# Patient Record
Sex: Female | Born: 1945
Health system: Southern US, Community
[De-identification: ages and names within clinical notes are randomized; demographics above are authoritative.]

## PROBLEM LIST (undated history)

## (undated) DIAGNOSIS — C801 Malignant (primary) neoplasm, unspecified: Secondary | ICD-10-CM

## (undated) DIAGNOSIS — Z923 Personal history of irradiation: Secondary | ICD-10-CM

---

## 1998-06-19 ENCOUNTER — Ambulatory Visit: Admission: RE | Admit: 1998-06-19 | Discharge: 1998-06-19 | Payer: Self-pay | Admitting: *Deleted

## 1998-06-21 ENCOUNTER — Ambulatory Visit (HOSPITAL_COMMUNITY): Admission: RE | Admit: 1998-06-21 | Discharge: 1998-06-21 | Payer: Self-pay | Admitting: *Deleted

## 1998-10-29 ENCOUNTER — Emergency Department (HOSPITAL_COMMUNITY): Admission: EM | Admit: 1998-10-29 | Discharge: 1998-10-29 | Payer: Self-pay

## 1998-12-09 DIAGNOSIS — Z923 Personal history of irradiation: Secondary | ICD-10-CM

## 1998-12-09 DIAGNOSIS — C801 Malignant (primary) neoplasm, unspecified: Secondary | ICD-10-CM

## 1998-12-09 HISTORY — DX: Personal history of irradiation: Z92.3

## 1998-12-09 HISTORY — PX: BREAST LUMPECTOMY: SHX2

## 1998-12-09 HISTORY — DX: Malignant (primary) neoplasm, unspecified: C80.1

## 1999-04-25 ENCOUNTER — Ambulatory Visit (HOSPITAL_COMMUNITY): Admission: RE | Admit: 1999-04-25 | Discharge: 1999-04-25 | Payer: Self-pay | Admitting: *Deleted

## 1999-04-25 ENCOUNTER — Encounter: Payer: Self-pay | Admitting: *Deleted

## 1999-04-26 ENCOUNTER — Encounter: Payer: Self-pay | Admitting: *Deleted

## 1999-04-26 ENCOUNTER — Ambulatory Visit (HOSPITAL_COMMUNITY): Admission: RE | Admit: 1999-04-26 | Discharge: 1999-04-26 | Payer: Self-pay | Admitting: *Deleted

## 1999-05-03 ENCOUNTER — Ambulatory Visit (HOSPITAL_COMMUNITY): Admission: RE | Admit: 1999-05-03 | Discharge: 1999-05-03 | Payer: Self-pay | Admitting: *Deleted

## 1999-05-16 ENCOUNTER — Ambulatory Visit (HOSPITAL_COMMUNITY): Admission: RE | Admit: 1999-05-16 | Discharge: 1999-05-16 | Payer: Self-pay | Admitting: *Deleted

## 1999-05-24 ENCOUNTER — Ambulatory Visit (HOSPITAL_COMMUNITY): Admission: RE | Admit: 1999-05-24 | Discharge: 1999-05-24 | Payer: Self-pay | Admitting: *Deleted

## 1999-06-09 ENCOUNTER — Encounter: Admission: RE | Admit: 1999-06-09 | Discharge: 1999-09-07 | Payer: Self-pay | Admitting: Radiation Oncology

## 1999-06-21 ENCOUNTER — Ambulatory Visit (HOSPITAL_COMMUNITY): Admission: RE | Admit: 1999-06-21 | Discharge: 1999-06-21 | Payer: Self-pay | Admitting: Family Medicine

## 1999-06-21 ENCOUNTER — Encounter: Payer: Self-pay | Admitting: Family Medicine

## 1999-11-09 ENCOUNTER — Other Ambulatory Visit: Admission: RE | Admit: 1999-11-09 | Discharge: 1999-11-09 | Payer: Self-pay | Admitting: *Deleted

## 1999-11-22 ENCOUNTER — Encounter: Payer: Self-pay | Admitting: *Deleted

## 1999-11-22 ENCOUNTER — Encounter: Admission: RE | Admit: 1999-11-22 | Discharge: 1999-11-22 | Payer: Self-pay | Admitting: *Deleted

## 2000-01-01 ENCOUNTER — Encounter: Payer: Self-pay | Admitting: *Deleted

## 2000-01-01 ENCOUNTER — Ambulatory Visit (HOSPITAL_COMMUNITY): Admission: RE | Admit: 2000-01-01 | Discharge: 2000-01-01 | Payer: Self-pay | Admitting: *Deleted

## 2000-07-30 ENCOUNTER — Encounter: Payer: Self-pay | Admitting: *Deleted

## 2000-07-30 ENCOUNTER — Ambulatory Visit (HOSPITAL_COMMUNITY): Admission: RE | Admit: 2000-07-30 | Discharge: 2000-07-30 | Payer: Self-pay | Admitting: *Deleted

## 2001-01-01 ENCOUNTER — Encounter: Admission: RE | Admit: 2001-01-01 | Discharge: 2001-01-01 | Payer: Self-pay | Admitting: Radiation Oncology

## 2001-05-28 ENCOUNTER — Encounter: Admission: RE | Admit: 2001-05-28 | Discharge: 2001-05-28 | Payer: Self-pay | Admitting: Radiation Oncology

## 2002-06-03 ENCOUNTER — Encounter: Admission: RE | Admit: 2002-06-03 | Discharge: 2002-06-03 | Payer: Self-pay | Admitting: Radiation Oncology

## 2003-06-06 ENCOUNTER — Encounter: Admission: RE | Admit: 2003-06-06 | Discharge: 2003-06-06 | Payer: Self-pay | Admitting: Family Medicine

## 2003-06-06 ENCOUNTER — Encounter: Payer: Self-pay | Admitting: Family Medicine

## 2004-02-27 ENCOUNTER — Other Ambulatory Visit: Admission: RE | Admit: 2004-02-27 | Discharge: 2004-02-27 | Payer: Self-pay | Admitting: Family Medicine

## 2004-06-06 ENCOUNTER — Encounter: Admission: RE | Admit: 2004-06-06 | Discharge: 2004-06-06 | Payer: Self-pay | Admitting: Family Medicine

## 2005-02-27 ENCOUNTER — Other Ambulatory Visit: Admission: RE | Admit: 2005-02-27 | Discharge: 2005-02-27 | Payer: Self-pay | Admitting: Family Medicine

## 2005-05-22 ENCOUNTER — Ambulatory Visit (HOSPITAL_COMMUNITY): Admission: RE | Admit: 2005-05-22 | Discharge: 2005-05-22 | Payer: Self-pay | Admitting: Gastroenterology

## 2005-06-07 ENCOUNTER — Encounter: Admission: RE | Admit: 2005-06-07 | Discharge: 2005-06-07 | Payer: Self-pay | Admitting: Family Medicine

## 2005-06-18 ENCOUNTER — Encounter: Admission: RE | Admit: 2005-06-18 | Discharge: 2005-06-18 | Payer: Self-pay | Admitting: Family Medicine

## 2005-06-23 ENCOUNTER — Encounter: Admission: RE | Admit: 2005-06-23 | Discharge: 2005-06-23 | Payer: Self-pay | Admitting: Family Medicine

## 2006-04-08 ENCOUNTER — Emergency Department (HOSPITAL_COMMUNITY): Admission: EM | Admit: 2006-04-08 | Discharge: 2006-04-09 | Payer: Self-pay | Admitting: Emergency Medicine

## 2006-04-15 ENCOUNTER — Other Ambulatory Visit: Admission: RE | Admit: 2006-04-15 | Discharge: 2006-04-15 | Payer: Self-pay | Admitting: Family Medicine

## 2006-06-09 ENCOUNTER — Encounter: Admission: RE | Admit: 2006-06-09 | Discharge: 2006-06-09 | Payer: Self-pay | Admitting: Family Medicine

## 2007-03-05 ENCOUNTER — Encounter: Admission: RE | Admit: 2007-03-05 | Discharge: 2007-03-05 | Payer: Self-pay | Admitting: Family Medicine

## 2007-04-12 ENCOUNTER — Encounter: Admission: RE | Admit: 2007-04-12 | Discharge: 2007-04-12 | Payer: Self-pay | Admitting: Family Medicine

## 2007-05-13 ENCOUNTER — Other Ambulatory Visit: Admission: RE | Admit: 2007-05-13 | Discharge: 2007-05-13 | Payer: Self-pay | Admitting: Family Medicine

## 2007-05-16 ENCOUNTER — Emergency Department (HOSPITAL_COMMUNITY): Admission: EM | Admit: 2007-05-16 | Discharge: 2007-05-16 | Payer: Self-pay | Admitting: *Deleted

## 2007-06-08 ENCOUNTER — Encounter: Admission: RE | Admit: 2007-06-08 | Discharge: 2007-06-08 | Payer: Self-pay | Admitting: Family Medicine

## 2007-06-29 ENCOUNTER — Emergency Department (HOSPITAL_COMMUNITY): Admission: EM | Admit: 2007-06-29 | Discharge: 2007-06-29 | Payer: Self-pay | Admitting: Emergency Medicine

## 2007-07-04 ENCOUNTER — Emergency Department (HOSPITAL_COMMUNITY): Admission: EM | Admit: 2007-07-04 | Discharge: 2007-07-04 | Payer: Self-pay | Admitting: Emergency Medicine

## 2007-07-06 ENCOUNTER — Encounter: Admission: RE | Admit: 2007-07-06 | Discharge: 2007-07-06 | Payer: Self-pay | Admitting: Gastroenterology

## 2007-07-10 ENCOUNTER — Ambulatory Visit (HOSPITAL_COMMUNITY): Admission: RE | Admit: 2007-07-10 | Discharge: 2007-07-10 | Payer: Self-pay | Admitting: Gastroenterology

## 2007-07-14 ENCOUNTER — Ambulatory Visit: Payer: Self-pay | Admitting: Hematology and Oncology

## 2007-07-15 ENCOUNTER — Encounter: Admission: RE | Admit: 2007-07-15 | Discharge: 2007-07-15 | Payer: Self-pay | Admitting: Gastroenterology

## 2008-03-07 ENCOUNTER — Encounter: Admission: RE | Admit: 2008-03-07 | Discharge: 2008-03-07 | Payer: Self-pay | Admitting: Family Medicine

## 2008-05-17 ENCOUNTER — Other Ambulatory Visit: Admission: RE | Admit: 2008-05-17 | Discharge: 2008-05-17 | Payer: Self-pay | Admitting: Family Medicine

## 2009-03-14 ENCOUNTER — Encounter: Admission: RE | Admit: 2009-03-14 | Discharge: 2009-03-14 | Payer: Self-pay | Admitting: Family Medicine

## 2009-07-04 ENCOUNTER — Other Ambulatory Visit: Admission: RE | Admit: 2009-07-04 | Discharge: 2009-07-04 | Payer: Self-pay | Admitting: Family Medicine

## 2010-03-20 ENCOUNTER — Encounter: Admission: RE | Admit: 2010-03-20 | Discharge: 2010-03-20 | Payer: Self-pay | Admitting: Family Medicine

## 2010-12-30 ENCOUNTER — Encounter: Payer: Self-pay | Admitting: Family Medicine

## 2011-02-11 ENCOUNTER — Other Ambulatory Visit: Payer: Self-pay | Admitting: Family Medicine

## 2011-02-11 DIAGNOSIS — Z1231 Encounter for screening mammogram for malignant neoplasm of breast: Secondary | ICD-10-CM

## 2011-04-08 ENCOUNTER — Ambulatory Visit
Admission: RE | Admit: 2011-04-08 | Discharge: 2011-04-08 | Disposition: A | Discharge status: Home / Self Care | Payer: 59 | Source: Ambulatory Visit | Attending: Family Medicine | Admitting: Family Medicine

## 2011-04-08 DIAGNOSIS — Z1231 Encounter for screening mammogram for malignant neoplasm of breast: Secondary | ICD-10-CM

## 2011-04-26 NOTE — Op Note (Signed)
NAME:  Dana Barr, Dana Barr NO.:  [id number]   MEDICAL RECORD NO.:  [id number]          PATIENT TYPE:  AMB   LOCATION:  ENDO                         FACILITY:  [facility]   PHYSICIAN:  Graylin Shiver, M.D.   DATE OF BIRTH:  1946/07/31   DATE OF PROCEDURE:  05/22/2005  DATE OF DISCHARGE:                                 OPERATIVE REPORT   INDICATIONS:  Heme-positive stool, family history of colon cancer.   Informed consent was obtained after explanation of the risks of bleeding,  infection and perforation.   PREMEDICATION:  Fentanyl 90 mcg IV, Versed 7.5 milligrams IV.   PROCEDURE:  With the patient in the left lateral decubitus position, a  rectal exam was performed. No masses were felt. The Olympus colonoscope was  inserted into the rectum and advanced around the colon to the cecum. Cecal  landmarks were identified. The cecum and ascending colon were normal. The  transverse colon normal. The descending colon, sigmoid, and rectum were  normal. She tolerated the procedure well without complications.   IMPRESSION:  Normal colonoscopy to the cecum.   I would recommend a follow-up screening colonoscopy again in 5 years due to  the family history of colon cancer in patient's mother.       SFG/MEDQ  D:  05/22/2005  T:  05/22/2005  Job:  [id number]   cc:   Dario Guardian, M.D.  [address]  [place]  [place] [address]  Fax: [phone]

## 2011-09-23 LAB — DIFFERENTIAL
Basophils Relative: 0
Eosinophils Absolute: 0.1
Eosinophils Relative: 2
Monocytes Absolute: 0.4
Monocytes Relative: 8
Neutro Abs: 3.2

## 2011-09-23 LAB — CBC
MCHC: 33.3
RBC: 4.89
RDW: 14

## 2011-09-23 LAB — POCT CARDIAC MARKERS
CKMB, poc: <1 — ABNORMAL LOW
Myoglobin, poc: 65.8

## 2011-09-23 LAB — COMPREHENSIVE METABOLIC PANEL
GFR calc Af Amer: >60
GFR calc non Af Amer: >60
Potassium: 3.7
Sodium: 143
Total Protein: 6.5

## 2011-09-26 LAB — POCT CARDIAC MARKERS: Troponin i, poc: <0.05

## 2012-04-13 ENCOUNTER — Other Ambulatory Visit: Payer: Self-pay | Admitting: Family Medicine

## 2012-04-13 DIAGNOSIS — Z1231 Encounter for screening mammogram for malignant neoplasm of breast: Secondary | ICD-10-CM

## 2012-05-05 ENCOUNTER — Ambulatory Visit
Admission: RE | Admit: 2012-05-05 | Discharge: 2012-05-05 | Disposition: A | Discharge status: Home / Self Care | Payer: BC Managed Care – PPO | Source: Ambulatory Visit | Attending: Family Medicine | Admitting: Family Medicine

## 2012-05-05 DIAGNOSIS — Z1231 Encounter for screening mammogram for malignant neoplasm of breast: Secondary | ICD-10-CM

## 2013-03-30 ENCOUNTER — Other Ambulatory Visit: Payer: Self-pay

## 2013-03-30 DIAGNOSIS — Z9889 Other specified postprocedural states: Secondary | ICD-10-CM

## 2013-03-30 DIAGNOSIS — Z853 Personal history of malignant neoplasm of breast: Secondary | ICD-10-CM

## 2013-03-30 DIAGNOSIS — Z1231 Encounter for screening mammogram for malignant neoplasm of breast: Secondary | ICD-10-CM

## 2013-05-25 ENCOUNTER — Ambulatory Visit
Admission: RE | Admit: 2013-05-25 | Discharge: 2013-05-25 | Disposition: A | Discharge status: Home / Self Care | Payer: BC Managed Care – PPO | Source: Ambulatory Visit

## 2013-05-25 DIAGNOSIS — Z1231 Encounter for screening mammogram for malignant neoplasm of breast: Secondary | ICD-10-CM

## 2013-05-25 DIAGNOSIS — Z9889 Other specified postprocedural states: Secondary | ICD-10-CM

## 2013-05-25 DIAGNOSIS — Z853 Personal history of malignant neoplasm of breast: Secondary | ICD-10-CM

## 2013-10-21 ENCOUNTER — Other Ambulatory Visit: Payer: Self-pay | Admitting: Family Medicine

## 2013-10-21 ENCOUNTER — Other Ambulatory Visit (HOSPITAL_COMMUNITY)
Admission: RE | Admit: 2013-10-21 | Discharge: 2013-10-21 | Disposition: A | Discharge status: Home / Self Care | Payer: Medicare Other | Source: Ambulatory Visit | Attending: Family Medicine | Admitting: Family Medicine

## 2013-10-21 DIAGNOSIS — Z124 Encounter for screening for malignant neoplasm of cervix: Secondary | ICD-10-CM | POA: Insufficient documentation

## 2014-04-26 ENCOUNTER — Other Ambulatory Visit: Payer: Self-pay

## 2014-04-26 DIAGNOSIS — Z1231 Encounter for screening mammogram for malignant neoplasm of breast: Secondary | ICD-10-CM

## 2014-05-30 ENCOUNTER — Other Ambulatory Visit: Payer: Self-pay | Admitting: Dermatology

## 2014-06-02 ENCOUNTER — Encounter (INDEPENDENT_AMBULATORY_CARE_PROVIDER_SITE_OTHER): Payer: Self-pay

## 2014-06-02 ENCOUNTER — Ambulatory Visit
Admission: RE | Admit: 2014-06-02 | Discharge: 2014-06-02 | Disposition: A | Discharge status: Home / Self Care | Payer: Medicare Other | Source: Ambulatory Visit

## 2014-06-02 DIAGNOSIS — Z1231 Encounter for screening mammogram for malignant neoplasm of breast: Secondary | ICD-10-CM

## 2015-04-04 ENCOUNTER — Other Ambulatory Visit: Payer: Self-pay

## 2015-04-04 DIAGNOSIS — Z1231 Encounter for screening mammogram for malignant neoplasm of breast: Secondary | ICD-10-CM

## 2015-06-05 ENCOUNTER — Ambulatory Visit
Admission: RE | Admit: 2015-06-05 | Discharge: 2015-06-05 | Disposition: A | Discharge status: Home / Self Care | Payer: Medicare Other | Source: Ambulatory Visit

## 2015-06-05 DIAGNOSIS — Z1231 Encounter for screening mammogram for malignant neoplasm of breast: Secondary | ICD-10-CM

## 2015-06-15 ENCOUNTER — Ambulatory Visit
Admission: RE | Admit: 2015-06-15 | Discharge: 2015-06-15 | Disposition: A | Discharge status: Home / Self Care | Payer: Medicare Other | Source: Ambulatory Visit | Attending: Family Medicine | Admitting: Family Medicine

## 2015-06-15 ENCOUNTER — Other Ambulatory Visit: Payer: Self-pay | Admitting: Family Medicine

## 2015-06-15 DIAGNOSIS — M545 Low back pain: Secondary | ICD-10-CM

## 2016-04-29 ENCOUNTER — Other Ambulatory Visit: Payer: Self-pay

## 2016-04-29 DIAGNOSIS — Z1231 Encounter for screening mammogram for malignant neoplasm of breast: Secondary | ICD-10-CM

## 2016-06-05 ENCOUNTER — Ambulatory Visit
Admission: RE | Admit: 2016-06-05 | Discharge: 2016-06-05 | Disposition: A | Discharge status: Home / Self Care | Payer: Medicare Other | Source: Ambulatory Visit

## 2016-06-05 DIAGNOSIS — Z1231 Encounter for screening mammogram for malignant neoplasm of breast: Secondary | ICD-10-CM

## 2017-04-25 ENCOUNTER — Other Ambulatory Visit: Payer: Self-pay | Admitting: Family Medicine

## 2017-04-25 DIAGNOSIS — Z1231 Encounter for screening mammogram for malignant neoplasm of breast: Secondary | ICD-10-CM

## 2017-06-06 ENCOUNTER — Ambulatory Visit
Admission: RE | Admit: 2017-06-06 | Discharge: 2017-06-06 | Disposition: A | Discharge status: Home / Self Care | Payer: Self-pay | Source: Ambulatory Visit | Attending: Family Medicine | Admitting: Family Medicine

## 2017-06-06 DIAGNOSIS — Z1231 Encounter for screening mammogram for malignant neoplasm of breast: Secondary | ICD-10-CM

## 2017-06-06 HISTORY — DX: Personal history of irradiation: Z92.3

## 2017-06-06 HISTORY — DX: Malignant (primary) neoplasm, unspecified: C80.1

## 2018-01-05 DIAGNOSIS — E782 Mixed hyperlipidemia: Secondary | ICD-10-CM | POA: Diagnosis not present

## 2018-01-05 DIAGNOSIS — Z1389 Encounter for screening for other disorder: Secondary | ICD-10-CM | POA: Diagnosis not present

## 2018-01-05 DIAGNOSIS — Z0000 Encounter for general adult medical examination without abnormal findings: Secondary | ICD-10-CM | POA: Diagnosis not present

## 2018-03-30 ENCOUNTER — Other Ambulatory Visit: Payer: Self-pay | Admitting: Family Medicine

## 2018-03-30 DIAGNOSIS — Z1231 Encounter for screening mammogram for malignant neoplasm of breast: Secondary | ICD-10-CM

## 2018-04-16 DIAGNOSIS — R300 Dysuria: Secondary | ICD-10-CM | POA: Diagnosis not present

## 2018-04-21 DIAGNOSIS — H5203 Hypermetropia, bilateral: Secondary | ICD-10-CM | POA: Diagnosis not present

## 2018-04-21 DIAGNOSIS — H52203 Unspecified astigmatism, bilateral: Secondary | ICD-10-CM | POA: Diagnosis not present

## 2018-04-21 DIAGNOSIS — H2513 Age-related nuclear cataract, bilateral: Secondary | ICD-10-CM | POA: Diagnosis not present

## 2018-04-21 DIAGNOSIS — H524 Presbyopia: Secondary | ICD-10-CM | POA: Diagnosis not present

## 2018-04-29 DIAGNOSIS — R3982 Chronic bladder pain: Secondary | ICD-10-CM | POA: Diagnosis not present

## 2018-04-29 DIAGNOSIS — R8290 Unspecified abnormal findings in urine: Secondary | ICD-10-CM | POA: Diagnosis not present

## 2018-05-01 DIAGNOSIS — R1030 Lower abdominal pain, unspecified: Secondary | ICD-10-CM | POA: Diagnosis not present

## 2018-05-01 DIAGNOSIS — R102 Pelvic and perineal pain: Secondary | ICD-10-CM | POA: Diagnosis not present

## 2018-05-06 DIAGNOSIS — R1030 Lower abdominal pain, unspecified: Secondary | ICD-10-CM | POA: Diagnosis not present

## 2018-05-06 DIAGNOSIS — R110 Nausea: Secondary | ICD-10-CM | POA: Diagnosis not present

## 2018-05-12 DIAGNOSIS — D259 Leiomyoma of uterus, unspecified: Secondary | ICD-10-CM | POA: Diagnosis not present

## 2018-05-13 ENCOUNTER — Other Ambulatory Visit: Payer: Self-pay | Admitting: Family Medicine

## 2018-05-13 DIAGNOSIS — R102 Pelvic and perineal pain: Secondary | ICD-10-CM

## 2018-05-27 DIAGNOSIS — R3982 Chronic bladder pain: Secondary | ICD-10-CM | POA: Diagnosis not present

## 2018-05-27 DIAGNOSIS — R8290 Unspecified abnormal findings in urine: Secondary | ICD-10-CM | POA: Diagnosis not present

## 2018-06-09 ENCOUNTER — Ambulatory Visit
Admission: RE | Admit: 2018-06-09 | Discharge: 2018-06-09 | Disposition: A | Discharge status: Home / Self Care | Payer: PPO | Source: Ambulatory Visit | Attending: Family Medicine | Admitting: Family Medicine

## 2018-06-09 DIAGNOSIS — Z1231 Encounter for screening mammogram for malignant neoplasm of breast: Secondary | ICD-10-CM

## 2018-06-30 DIAGNOSIS — H6123 Impacted cerumen, bilateral: Secondary | ICD-10-CM | POA: Diagnosis not present

## 2018-08-11 DIAGNOSIS — H33052 Total retinal detachment, left eye: Secondary | ICD-10-CM | POA: Diagnosis not present

## 2018-08-11 DIAGNOSIS — H40031 Anatomical narrow angle, right eye: Secondary | ICD-10-CM | POA: Diagnosis not present

## 2018-08-11 DIAGNOSIS — H43813 Vitreous degeneration, bilateral: Secondary | ICD-10-CM | POA: Diagnosis not present

## 2018-08-11 DIAGNOSIS — Z8669 Personal history of other diseases of the nervous system and sense organs: Secondary | ICD-10-CM | POA: Diagnosis not present

## 2018-10-22 DIAGNOSIS — H16223 Keratoconjunctivitis sicca, not specified as Sjogren's, bilateral: Secondary | ICD-10-CM | POA: Diagnosis not present

## 2018-10-22 DIAGNOSIS — H0100B Unspecified blepharitis left eye, upper and lower eyelids: Secondary | ICD-10-CM | POA: Diagnosis not present

## 2018-10-22 DIAGNOSIS — H04123 Dry eye syndrome of bilateral lacrimal glands: Secondary | ICD-10-CM | POA: Diagnosis not present

## 2018-10-22 DIAGNOSIS — H0100A Unspecified blepharitis right eye, upper and lower eyelids: Secondary | ICD-10-CM | POA: Diagnosis not present

## 2018-11-08 DIAGNOSIS — M545 Low back pain: Secondary | ICD-10-CM | POA: Diagnosis not present

## 2019-02-06 ENCOUNTER — Encounter (HOSPITAL_COMMUNITY): Payer: Self-pay

## 2019-02-06 ENCOUNTER — Ambulatory Visit (HOSPITAL_COMMUNITY)
Admission: EM | Admit: 2019-02-06 | Discharge: 2019-02-06 | Disposition: A | Discharge status: Home / Self Care | Payer: Medicare Other | Attending: Family Medicine | Admitting: Family Medicine

## 2019-02-06 ENCOUNTER — Other Ambulatory Visit: Payer: Self-pay

## 2019-02-06 DIAGNOSIS — R6889 Other general symptoms and signs: Secondary | ICD-10-CM | POA: Diagnosis not present

## 2019-02-06 MED ORDER — BENZONATATE 100 MG PO CAPS: 100.0000 mg | ORAL_CAPSULE | Freq: Three times a day (TID) | ORAL | 0 refills | Status: DC

## 2019-02-06 MED ORDER — ALBUTEROL SULFATE HFA 108 (90 BASE) MCG/ACT IN AERS
INHALATION_SPRAY | RESPIRATORY_TRACT | Status: AC
  Filled 2019-02-06: qty 6.7

## 2019-02-06 MED ORDER — ALBUTEROL SULFATE HFA 108 (90 BASE) MCG/ACT IN AERS
2.0000 | INHALATION_SPRAY | Freq: Once | RESPIRATORY_TRACT | Status: AC
  Administered 2019-02-06: 2 via RESPIRATORY_TRACT

## 2019-02-06 MED ORDER — SALINE SPRAY 0.65 % NA SOLN: 1.0000 | NASAL | 0 refills | Status: DC | PRN

## 2019-02-06 NOTE — Discharge Instructions (Addendum)
Outside of window for tamiflu prescription.   Get plenty of rest and push fluids.  Drink at least half your body weight in ounces.  You may supplement with OTC Pedialyte or oral rehydration solution Tessalon Perles prescribed for cough Ocean nasal spray for nasal congestion and runny nose Inhaler given in office.  Use as needed for shortness of breath and/or wheezing Use OTC tylenol and/or aleve every 4 hours for fever, body aches, and chills Follow up with PCP this week for recheck and ensure you are improving.   Go to the ED if you have any new or worsening symptoms fever that does not moderate with tylenol, chills, nausea, vomiting, chest pain, worsening cough, shortness of breath, wheezing, abdominal pain, changes in bowel or bladder habits, etc..Marland Kitchen

## 2019-02-06 NOTE — ED Triage Notes (Addendum)
Pt cc asthma has flared up x 5 days. Pt has headache and a little diarrhea 3 days.

## 2019-02-06 NOTE — ED Provider Notes (Signed)
[place]   [id number] 02/06/19 Arrival Time: 4098   CC: URI symptoms   SUBJECTIVE: History from: patient.  Dana Barr is a 73 y.o. female hx significant for asthma, and uses albuterol inhaler, who presents with abrupt onset of nasal congestion, runny nose, sore throat, HA, and fever x 4 days.  Admits to sick exposure to sister with the flu.  Recently visited new grandchild in the hospital as well.  Has tried OTC children's cold medication with minimal relief.  Denies previous symptoms in the past.  Also mentions some diarrhea. Denies chills, SOB, wheezing, chest pain, nausea, changes in bowel or bladder habits.    Requests inhaler refill.    Received flu shot this year: yes.  ROS: As per HPI.  Past Medical History:  Diagnosis Date  . Cancer [facility]) 2000   Right breast cancer   . Personal history of radiation therapy 2000   Past Surgical History:  Procedure Laterality Date  . BREAST LUMPECTOMY Right 2000   Allergies  Allergen Reactions  . Codeine   . Predicort [Prednisolone]   . Prednisone    No current facility-administered medications on file prior to encounter.    No current outpatient medications on file prior to encounter.   Social History   Socioeconomic History  . Marital status: Divorced    Spouse name: Not on file  . Number of children: Not on file  . Years of education: Not on file  . Highest education level: Not on file  Occupational History  . Not on file  Social Needs  . Financial resource strain: Not on file  . Food insecurity:    Worry: Not on file    Inability: Not on file  . Transportation needs:    Medical: Not on file    Non-medical: Not on file  Tobacco Use  . Smoking status: Never Smoker  . Smokeless tobacco: Never Used  Substance and Sexual Activity  . Alcohol use: Never    Frequency: Never  . Drug use: Never  . Sexual activity: Not on file  Lifestyle  . Physical activity:    Days per week: Not on file   Minutes per session: Not on file  . Stress: Not on file  Relationships  . Social connections:    Talks on phone: Not on file    Gets together: Not on file    Attends religious service: Not on file    Active member of club or organization: Not on file    Attends meetings of clubs or organizations: Not on file    Relationship status: Not on file  . Intimate partner violence:    Fear of current or ex partner: Not on file    Emotionally abused: Not on file    Physically abused: Not on file    Forced sexual activity: Not on file  Other Topics Concern  . Not on file  Social History Narrative  . Not on file   History reviewed. No pertinent family history.  OBJECTIVE:  Vitals:   02/06/19 1645 02/06/19 1646  BP: 108/72   Pulse: (!) 110   Resp: 16   Temp: 100.2 F (37.9 C)   SpO2: 100%   Weight:  115 lb (52.2 kg)     General appearance: alert; appears mildly fatigued, but nontoxic; speaking in full sentences and tolerating own secretions HEENT: NCAT; Ears: EACs clear, TMs pearly gray; Eyes: PERRL.  EOM grossly intact. Nose: nares patent without rhinorrhea, Throat: oropharynx clear,  tonsils non erythematous or enlarged, uvula midline  Neck: supple without LAD Lungs: unlabored respirations, symmetrical air entry; cough: mild; no respiratory distress; CTAB Heart: regular rate and rhythm.  Radial pulses 2+ symmetrical bilaterally Skin: warm and dry Psychological: alert and cooperative; normal mood and affect  ASSESSMENT & PLAN:  1. Flu-like symptoms     Meds ordered this encounter  Medications  . albuterol (PROVENTIL HFA;VENTOLIN HFA) 108 (90 Base) MCG/ACT inhaler 2 puff  . benzonatate (TESSALON) 100 MG capsule    Sig: Take 1 capsule (100 mg total) by mouth every 8 (eight) hours.    Dispense:  21 capsule    Refill:  0    Order Specific Question:   Supervising Provider    Answer:   Raylene Everts [[id number]]  . sodium chloride (OCEAN) 0.65 % SOLN nasal spray    Sig: Place  1 spray into both nostrils as needed.    Dispense:  30 mL    Refill:  0    Order Specific Question:   Supervising Provider    Answer:   Raylene Everts [[id number]]   Outside of window for tamiflu prescription.   Get plenty of rest and push fluids.  Drink at least half your body weight in ounces.  You may supplement with OTC Pedialyte or oral rehydration solution Tessalon Perles prescribed for cough Ocean nasal spray for nasal congestion and runny nose Inhaler given in office.  Use as needed for shortness of breath and/or wheezing Use OTC tylenol and/or aleve every 4 hours for fever, body aches, and chills Follow up with PCP this week for recheck and ensure you are improving.   Go to the ED if you have any new or worsening symptoms fever that does not moderate with tylenol, chills, nausea, vomiting, chest pain, worsening cough, shortness of breath, wheezing, abdominal pain, changes in bowel or bladder habits, etc...   Reviewed expectations re: course of current medical issues. Questions answered. Outlined signs and symptoms indicating need for more acute intervention. Patient verbalized understanding. After Visit Summary given.         Lestine Box, PA-C 02/06/19 4492

## 2019-04-23 ENCOUNTER — Other Ambulatory Visit: Payer: Self-pay | Admitting: Family Medicine

## 2019-04-23 DIAGNOSIS — Z1231 Encounter for screening mammogram for malignant neoplasm of breast: Secondary | ICD-10-CM

## 2019-06-15 ENCOUNTER — Ambulatory Visit
Admission: RE | Admit: 2019-06-15 | Discharge: 2019-06-15 | Disposition: A | Discharge status: Home / Self Care | Payer: Medicare Other | Source: Ambulatory Visit | Attending: Family Medicine | Admitting: Family Medicine

## 2019-06-15 DIAGNOSIS — Z1231 Encounter for screening mammogram for malignant neoplasm of breast: Secondary | ICD-10-CM

## 2019-11-17 ENCOUNTER — Other Ambulatory Visit: Payer: Self-pay

## 2019-11-17 ENCOUNTER — Encounter: Payer: Self-pay | Admitting: Neurology

## 2019-11-17 ENCOUNTER — Ambulatory Visit: Payer: Medicare Other | Admitting: Neurology

## 2019-11-17 VITALS — BP 124/73 | HR 71 | Temp 97.6°F | Ht 64.0 in | Wt 115.0 lb

## 2019-11-17 DIAGNOSIS — G500 Trigeminal neuralgia: Secondary | ICD-10-CM | POA: Diagnosis not present

## 2019-11-17 NOTE — Patient Instructions (Signed)
Trigeminal Neuralgia  Trigeminal neuralgia is a nerve disorder that causes severe pain on one side of the face. The pain may last from a few seconds to several minutes. The pain is usually only on one side of the face. Symptoms may occur for days, weeks, or months and then go away for months or years. The pain may return and be worse than before. What are the causes? This condition is caused by damage or pressure to a nerve in the head that is called the trigeminal nerve. An attack can be triggered by:  Talking.  Chewing.  Putting on makeup.  Washing your face.  Shaving your face.  Brushing your teeth.  Touching your face. What increases the risk? You are more likely to develop this condition if you:  Are 50 years of age or older.  Are female. What are the signs or symptoms? The main symptom of this condition is severe pain in the:  Jaw.  Lips.  Eyes.  Nose.  Scalp.  Forehead.  Face. The pain may be:  Intense.  Stabbing.  Electric.  Shock-like. How is this diagnosed? This condition is diagnosed with a physical exam. A CT scan or an MRI may be done to rule out other conditions that can cause facial pain. How is this treated? This condition may be treated with:  Avoiding the things that trigger your symptoms.  Taking prescription medicines (anticonvulsants).  Having surgery. This may be done in severe cases if other medical treatment does not provide relief.  Having procedures such as ablation, thermal, or radiation therapy. It may take up to one month for treatment to start relieving the pain. Follow these instructions at home: Managing pain  Learn as much as you can about how to manage your pain. Ask your health care provider if a pain specialist would be helpful.  Consider talking with a mental health care provider (psychologist) about how to cope with the pain.  Consider joining a pain support group. General instructions  Take  over-the-counter and prescription medicines only as told by your health care provider.  Avoid the things that trigger your symptoms. It may help to: ? Chew on the unaffected side of your mouth. ? Avoid touching your face. ? Avoid blasts of hot or cold air.  Follow your treatment plan as told by your health care provider. This may include: ? Cognitive or behavioral therapy. ? Gentle, regular exercise. ? Meditation or yoga. ? Aromatherapy.  Keep all follow-up visits as told by your health care provider. You may need to be monitored closely to make sure treatment is working well for you. Where to find more information  Facial Pain Association: fpa-support.org Contact a health care provider if:  Your medicine is not helping your symptoms.  You have side effects from the medicine used for treatment.  You develop new, unexplained symptoms, such as: ? Double vision. ? Facial weakness. ? Facial numbness. ? Changes in hearing or balance.  You feel depressed. Get help right away if:  Your pain is severe and is not getting better.  You develop suicidal thoughts. If you ever feel like you may hurt yourself or others, or have thoughts about taking your own life, get help right away. You can go to your nearest emergency department or call:  Your local emergency services (911 in the [place]  A suicide crisis helpline, such as the National Suicide Prevention Lifeline at 1-800-273-8255. This is open 24 hours a day. Summary  Trigeminal neuralgia is a   nerve disorder that causes severe pain on one side of the face. The pain may last from a few seconds to several minutes.  This condition is caused by damage or pressure to a nerve in the head that is called the trigeminal nerve.  Treatment may include avoiding the things that trigger your symptoms, taking medicines, or having surgery or procedures. It may take up to one month for treatment to start relieving the pain.  Avoid the things that  trigger your symptoms.  Keep all follow-up visits as told by your health care provider. You may need to be monitored closely to make sure treatment is working well for you. This information is not intended to replace advice given to you by your health care provider. Make sure you discuss any questions you have with your health care provider. Document Released: 11/22/2000 Document Revised: 10/12/2018 Document Reviewed: 10/12/2018 Elsevier Patient Education  2020 Elsevier Inc.  
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
## 2019-11-26 ENCOUNTER — Ambulatory Visit
Admission: RE | Admit: 2019-11-26 | Discharge: 2019-11-26 | Disposition: A | Discharge status: Home / Self Care | Payer: Medicare Other | Source: Ambulatory Visit | Attending: Neurology | Admitting: Neurology

## 2019-11-26 ENCOUNTER — Other Ambulatory Visit: Payer: Medicare Other

## 2019-11-26 ENCOUNTER — Other Ambulatory Visit: Payer: Self-pay

## 2019-11-26 DIAGNOSIS — G500 Trigeminal neuralgia: Secondary | ICD-10-CM

## 2019-11-29 NOTE — Progress Notes (Signed)
IMPRESSION:   CT head (without contrast)  demonstrating: - Moderate periventricular and subcortical chronic small vessel ischemic disease.  - No acute findings.  No further comment about distribution of atrophy by radiologist.

## 2020-01-06 ENCOUNTER — Ambulatory Visit: Payer: Medicare Other

## 2020-01-14 ENCOUNTER — Ambulatory Visit: Payer: Medicare Other | Attending: Internal Medicine

## 2020-01-14 DIAGNOSIS — Z23 Encounter for immunization: Secondary | ICD-10-CM | POA: Insufficient documentation

## 2020-01-14 NOTE — Progress Notes (Signed)
   Covid-19 Vaccination Clinic  Name:  Dana Barr    MRN: [id number] DOB: [birth year]  01/14/2020  [name] was observed post Covid-19 immunization for 15 minutes without incidence. She was provided with Vaccine Information Sheet and instruction to access the V-Safe system.   [name] was instructed to call 911 with any severe reactions post vaccine: Marland Kitchen Difficulty breathing  . Swelling of your face and throat  . A fast heartbeat  . A bad rash all over your body  . Dizziness and weakness    Immunizations Administered    Name Date Dose VIS Date Route   Pfizer COVID-19 Vaccine 01/14/2020  2:59 PM 0.3 mL 11/19/2019 Intramuscular   Manufacturer: [place]   Lot: [id number]   [place]: [id number]
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
## 2020-01-19 ENCOUNTER — Encounter: Payer: Self-pay | Admitting: Family Medicine

## 2020-01-19 ENCOUNTER — Ambulatory Visit: Payer: Medicare Other | Admitting: Family Medicine

## 2020-01-19 ENCOUNTER — Other Ambulatory Visit: Payer: Self-pay

## 2020-01-19 VITALS — BP 122/80 | HR 66 | Temp 96.8°F | Ht 64.0 in | Wt 113.6 lb

## 2020-01-19 DIAGNOSIS — G500 Trigeminal neuralgia: Secondary | ICD-10-CM

## 2020-01-19 NOTE — Patient Instructions (Signed)
Continue complementary therapies at home  Call for worsening symptoms  Trigeminal Neuralgia  Trigeminal neuralgia is a nerve disorder that causes severe pain on one side of the face. The pain may last from a few seconds to several minutes. The pain is usually only on one side of the face. Symptoms may occur for days, weeks, or months and then go away for months or years. The pain may return and be worse than before. What are the causes? This condition is caused by damage or pressure to a nerve in the head that is called the trigeminal nerve. An attack can be triggered by:  Talking.  Chewing.  Putting on makeup.  Washing your face.  Shaving your face.  Brushing your teeth.  Touching your face. What increases the risk? You are more likely to develop this condition if you:  Are 95 years of age or older.  Are female. What are the signs or symptoms? The main symptom of this condition is severe pain in the:  Jaw.  Lips.  Eyes.  Nose.  Scalp.  Forehead.  Face. The pain may be:  Intense.  Stabbing.  Electric.  Shock-like. How is this diagnosed? This condition is diagnosed with a physical exam. A CT scan or an MRI may be done to rule out other conditions that can cause facial pain. How is this treated? This condition may be treated with:  Avoiding the things that trigger your symptoms.  Taking prescription medicines (anticonvulsants).  Having surgery. This may be done in severe cases if other medical treatment does not provide relief.  Having procedures such as ablation, thermal, or radiation therapy. It may take up to one month for treatment to start relieving the pain. Follow these instructions at home: Managing pain  Learn as much as you can about how to manage your pain. Ask your health care provider if a pain specialist would be helpful.  Consider talking with a mental health care provider (psychologist) about how to cope with the  pain.  Consider joining a pain support group. General instructions  Take over-the-counter and prescription medicines only as told by your health care provider.  Avoid the things that trigger your symptoms. It may help to: ? Chew on the unaffected side of your mouth. ? Avoid touching your face. ? Avoid blasts of hot or cold air.  Follow your treatment plan as told by your health care provider. This may include: ? Cognitive or behavioral therapy. ? Gentle, regular exercise. ? Meditation or yoga. ? Aromatherapy.  Keep all follow-up visits as told by your health care provider. You may need to be monitored closely to make sure treatment is working well for you. Where to find more information  Facial Pain Association: fpa-support.org Contact a health care provider if:  Your medicine is not helping your symptoms.  You have side effects from the medicine used for treatment.  You develop new, unexplained symptoms, such as: ? Double vision. ? Facial weakness. ? Facial numbness. ? Changes in hearing or balance.  You feel depressed. Get help right away if:  Your pain is severe and is not getting better.  You develop suicidal thoughts. If you ever feel like you may hurt yourself or others, or have thoughts about taking your own life, get help right away. You can go to your nearest emergency department or call:  Your local emergency services (911 in the [place]  A suicide crisis helpline, such as the [facility] at (541) 453-2245. This is  open 24 hours a day. Summary  Trigeminal neuralgia is a nerve disorder that causes severe pain on one side of the face. The pain may last from a few seconds to several minutes.  This condition is caused by damage or pressure to a nerve in the head that is called the trigeminal nerve.  Treatment may include avoiding the things that trigger your symptoms, taking medicines, or having surgery or procedures. It may take up to  one month for treatment to start relieving the pain.  Avoid the things that trigger your symptoms.  Keep all follow-up visits as told by your health care provider. You may need to be monitored closely to make sure treatment is working well for you. This information is not intended to replace advice given to you by your health care provider. Make sure you discuss any questions you have with your health care provider. Document Revised: 10/12/2018 Document Reviewed: 10/12/2018 Elsevier Patient Education  [place].

## 2020-01-27 ENCOUNTER — Ambulatory Visit: Payer: Medicare Other

## 2020-02-08 ENCOUNTER — Ambulatory Visit: Payer: Medicare Other | Attending: Internal Medicine

## 2020-02-08 DIAGNOSIS — Z23 Encounter for immunization: Secondary | ICD-10-CM

## 2020-02-08 NOTE — Progress Notes (Signed)
   Covid-19 Vaccination Clinic  Name:  Dana Barr    MRN: [id number] DOB: 10/28/1946  02/08/2020  [name] was observed post Covid-19 immunization for 15 minutes without incident. She was provided with Vaccine Information Sheet and instruction to access the V-Safe system.   [name] was instructed to call 911 with any severe reactions post vaccine: Marland Kitchen Difficulty breathing  . Swelling of face and throat  . A fast heartbeat  . A bad rash all over body  . Dizziness and weakness   Immunizations Administered    Name Date Dose VIS Date Route   Pfizer COVID-19 Vaccine 02/08/2020  2:33 PM 0.3 mL 11/19/2019 Intramuscular   Manufacturer: [place]   Lot: [id number]   [place]: [phone]

## 2020-02-22 ENCOUNTER — Other Ambulatory Visit: Payer: Self-pay | Admitting: Family Medicine

## 2020-02-22 DIAGNOSIS — Z1231 Encounter for screening mammogram for malignant neoplasm of breast: Secondary | ICD-10-CM

## 2020-06-01 ENCOUNTER — Other Ambulatory Visit: Payer: Self-pay

## 2020-06-01 ENCOUNTER — Emergency Department (HOSPITAL_COMMUNITY): Payer: Medicare Other

## 2020-06-01 ENCOUNTER — Emergency Department (HOSPITAL_COMMUNITY)
Admission: EM | Admit: 2020-06-01 | Discharge: 2020-06-02 | Disposition: A | Discharge status: Home / Self Care | Payer: Medicare Other | Attending: Emergency Medicine | Admitting: Emergency Medicine

## 2020-06-01 ENCOUNTER — Encounter (HOSPITAL_COMMUNITY): Payer: Self-pay | Admitting: Emergency Medicine

## 2020-06-01 DIAGNOSIS — Z923 Personal history of irradiation: Secondary | ICD-10-CM | POA: Insufficient documentation

## 2020-06-01 DIAGNOSIS — Z7982 Long term (current) use of aspirin: Secondary | ICD-10-CM | POA: Diagnosis not present

## 2020-06-01 DIAGNOSIS — Z853 Personal history of malignant neoplasm of breast: Secondary | ICD-10-CM | POA: Diagnosis not present

## 2020-06-01 DIAGNOSIS — R197 Diarrhea, unspecified: Secondary | ICD-10-CM | POA: Insufficient documentation

## 2020-06-01 DIAGNOSIS — R109 Unspecified abdominal pain: Secondary | ICD-10-CM | POA: Insufficient documentation

## 2020-06-01 LAB — COMPREHENSIVE METABOLIC PANEL
ALT: 17 U/L (ref 0–44)
AST: 21 U/L (ref 15–41)
Albumin: 4.7 g/dL (ref 3.5–5.0)
Alkaline Phosphatase: 63 U/L (ref 38–126)
Anion gap: 10 (ref 5–15)
BUN: 21 mg/dL (ref 8–23)
CO2: 27 mmol/L (ref 22–32)
Calcium: 9.9 mg/dL (ref 8.9–10.3)
Chloride: 102 mmol/L (ref 98–111)
Creatinine, Ser: 0.82 mg/dL (ref 0.44–1.00)
GFR calc Af Amer: >60 mL/min (ref >60)
GFR calc non Af Amer: >60 mL/min (ref >60)
Glucose, Bld: 112 mg/dL — ABNORMAL HIGH (ref 70–99)
Potassium: 3.6 mmol/L (ref 3.5–5.1)
Sodium: 139 mmol/L (ref 135–145)
Total Bilirubin: 0.6 mg/dL (ref 0.3–1.2)
Total Protein: 7.5 g/dL (ref 6.5–8.1)

## 2020-06-01 LAB — URINALYSIS, ROUTINE W REFLEX MICROSCOPIC
Bacteria, UA: NONE SEEN
Bilirubin Urine: NEGATIVE
Glucose, UA: NEGATIVE mg/dL
Ketones, ur: NEGATIVE mg/dL
Leukocytes,Ua: NEGATIVE
Nitrite: NEGATIVE
Protein, ur: NEGATIVE mg/dL
Specific Gravity, Urine: 1.026 (ref 1.005–1.030)
pH: 6 (ref 5.0–8.0)

## 2020-06-01 LAB — C DIFFICILE QUICK SCREEN W PCR REFLEX
C Diff antigen: NEGATIVE
C Diff interpretation: NOT DETECTED
C Diff toxin: NEGATIVE

## 2020-06-01 LAB — CBC
HCT: 41.1 % (ref 36.0–46.0)
Hemoglobin: 12.9 g/dL (ref 12.0–15.0)
MCH: 26.8 pg (ref 26.0–34.0)
MCHC: 31.4 g/dL (ref 30.0–36.0)
MCV: 85.3 fL (ref 80.0–100.0)
Platelets: 235 10*3/uL (ref 150–400)
RBC: 4.82 MIL/uL (ref 3.87–5.11)
RDW: 14.1 % (ref 11.5–15.5)
WBC: 5.3 10*3/uL (ref 4.0–10.5)
nRBC: 0 % (ref 0.0–0.2)

## 2020-06-01 LAB — LIPASE, BLOOD: Lipase: 26 U/L (ref 11–51)

## 2020-06-01 MED ORDER — SODIUM CHLORIDE (PF) 0.9 % IJ SOLN
INTRAMUSCULAR | Status: AC
  Filled 2020-06-01: qty 50

## 2020-06-01 MED ORDER — SODIUM CHLORIDE 0.9% FLUSH: 3.0000 mL | Freq: Once | INTRAVENOUS | Status: DC

## 2020-06-01 MED ORDER — SODIUM CHLORIDE 0.9 % IV BOLUS
1000.0000 mL | Freq: Once | INTRAVENOUS | Status: AC
  Administered 2020-06-01: 1000 mL via INTRAVENOUS

## 2020-06-01 MED ORDER — IOHEXOL 300 MG/ML  SOLN
100.0000 mL | Freq: Once | INTRAMUSCULAR | Status: AC | PRN
  Administered 2020-06-01: 80 mL via INTRAVENOUS

## 2020-06-01 NOTE — ED Provider Notes (Signed)
[facility] DEPT Provider Note   CSN: [id number] Arrival date & time: 06/01/20  1724     History Chief Complaint  Patient presents with  . Diarrhea    Dana Barr is a 74 y.o. female hx of R breast cancer s/p radiation, here presenting with diarrhea.  Patient has loose stools for the last week or so and states they have become more watery.  She states every time she eats something she immediately has diarrhea.  She states that it is about 3-4 times a day now.  Patient had a telemetry visit with her doctor yesterday and was prescribed Augmentin for presumed diverticulitis.  Patient states that she has esophageal strictures so she asked for liquid Augmentin but the pharmacy did not have it.  Patient had worsening pain and cramps today and diarrhea so came here for further evaluation.  Patient has no history of C. difficile in the past.  The history is provided by the patient.       Past Medical History:  Diagnosis Date  . Cancer [facility]) 2000   Right breast cancer   . Personal history of radiation therapy 2000    There are no problems to display for this patient.   Past Surgical History:  Procedure Laterality Date  . BREAST LUMPECTOMY Right 2000     OB History   No obstetric history on file.     No family history on file.  Social History   Tobacco Use  . Smoking status: Never Smoker  . Smokeless tobacco: Never Used  Substance Use Topics  . Alcohol use: Never  . Drug use: Never    Home Medications Prior to Admission medications   Medication Sig Start Date End Date Taking? Authorizing Provider  aspirin 81 MG chewable tablet Chew by mouth daily.   Yes [provider]  Cholecalciferol 25 MCG (1000 UT) tablet Take 1,000 Units by mouth daily.    Yes [provider]  metroNIDAZOLE (METROGEL) 0.75 % gel Apply 1 application topically 2 (two) times daily.   Yes [provider]    Allergies    Codeine,  Predicort [prednisolone], and Prednisone  Review of Systems   Review of Systems  Gastrointestinal: Positive for abdominal pain and diarrhea.  All other systems reviewed and are negative.   Physical Exam Updated Vital Signs BP 122/70 (BP Location: Left Arm)   Pulse 73   Temp 98.8 F (37.1 C) (Oral)   Resp 17   SpO2 98%   Physical Exam Vitals and nursing note reviewed.  HENT:     Head: Normocephalic.     Nose: Nose normal.     Mouth/Throat:     Mouth: Mucous membranes are dry.  Eyes:     Extraocular Movements: Extraocular movements intact.     Pupils: Pupils are equal, round, and reactive to light.  Cardiovascular:     Rate and Rhythm: Normal rate and regular rhythm.     Pulses: Normal pulses.     Heart sounds: Normal heart sounds.  Pulmonary:     Effort: Pulmonary effort is normal.     Breath sounds: Normal breath sounds.  Abdominal:     General: Abdomen is flat.     Palpations: Abdomen is soft.     Comments: Mild LLQ tenderness   Musculoskeletal:        General: Normal range of motion.     Cervical back: Normal range of motion.  Skin:    General: Skin is  warm.     Capillary Refill: Capillary refill takes less than 2 seconds.  Neurological:     General: No focal deficit present.     Mental Status: She is alert and oriented to person, place, and time.  Psychiatric:        Mood and Affect: Mood normal.        Behavior: Behavior normal.     ED Results / Procedures / Treatments   Labs (all labs ordered are listed, but only abnormal results are displayed) Labs Reviewed  COMPREHENSIVE METABOLIC PANEL - Abnormal; Notable for the following components:      Result Value   Glucose, Bld 112 (*)    All other components within normal limits  GASTROINTESTINAL PANEL BY PCR, STOOL (REPLACES STOOL CULTURE)  C DIFFICILE QUICK SCREEN W PCR REFLEX  LIPASE, BLOOD  CBC  URINALYSIS, ROUTINE W REFLEX MICROSCOPIC    EKG None  Radiology CT ABDOMEN PELVIS W  CONTRAST  Result Date: 06/01/2020 CLINICAL DATA:  74 year old female with abdominal distension and diarrhea. EXAM: CT ABDOMEN AND PELVIS WITH CONTRAST TECHNIQUE: Multidetector CT imaging of the abdomen and pelvis was performed using the standard protocol following bolus administration of intravenous contrast. CONTRAST:  15mL OMNIPAQUE IOHEXOL 300 MG/ML  SOLN COMPARISON:  CT abdomen pelvis dated 05/01/2018. FINDINGS: Lower chest: The visualized lung bases are clear. No intra-abdominal free air or free fluid. Hepatobiliary: No focal liver abnormality is seen. No gallstones, gallbladder wall thickening, or biliary dilatation. Pancreas: Unremarkable. No pancreatic ductal dilatation or surrounding inflammatory changes. Spleen: Normal in size without focal abnormality. Adrenals/Urinary Tract: The adrenal glands are unremarkable. There is no hydronephrosis on either side. There is symmetric enhancement and excretion of contrast by both kidneys. There is non rotation of the right kidney. There is a 3.5 cm right renal inferior pole cyst and a subcentimeter right renal upper pole hypodense lesion which is too small to characterize. The visualized ureters and urinary bladder appear unremarkable. Stomach/Bowel: There is loose stool throughout the colon compatible with diarrheal state. Correlation with clinical exam and stool cultures recommended. There is no bowel obstruction or active inflammation. The appendix is not visualized with certainty. No inflammatory changes identified in the right lower quadrant. Vascular/Lymphatic: The abdominal aorta and IVC unremarkable. No portal venous gas. There is no adenopathy. Reproductive: The uterus is suboptimally visualized. No adnexal masses. Other: Biopsy clips in the right breast. Musculoskeletal: Osteopenia. No acute osseous pathology. IMPRESSION: Diarrheal state. Correlation with clinical exam and stool cultures recommended. No bowel obstruction. Electronically Signed   By: Anner Crete M.D.   On: 06/01/2020 21:51    Procedures Procedures (including critical care time)  Medications Ordered in ED Medications  sodium chloride flush (NS) 0.9 % injection 3 mL (has no administration in time range)  sodium chloride (PF) 0.9 % injection (has no administration in time range)  sodium chloride 0.9 % bolus 1,000 mL (1,000 mLs Intravenous New Bag/Given 06/01/20 2123)  iohexol (OMNIPAQUE) 300 MG/ML solution 100 mL (80 mLs Intravenous Contrast Given 06/01/20 2134)    ED Course  I have reviewed the triage vital signs and the nursing notes.  Pertinent labs & imaging results that were available during my care of the patient were reviewed by me and considered in my medical decision making (see chart for details).    MDM Rules/Calculators/A&P                          Rhea Bleacher  Jeter is a 74 y.o. female you presenting with abdominal pain and diarrhea.  Consider viral gastroenteritis versus C. difficile versus IBS.  Will get CBC, CMP, CT abdomen pelvis.  Patient able to give a stool sample, will send off C. difficile and GI pathogen panel.  11:16 PM WBC nl. CT showed no diverticulitis or colitis, just diarrheal state. C diff negative.  She inquired about antibiotics and I told her that we should hold off on that as it may cause C. Difficile.   GI pathogen panel is sent.  At this point, since she has no C. difficile, I think she is safe to use Imodium. I told her to use Imodium and every time she has diarrhea up to about 10 times a day.  She has GI follow-up with Dr. Michail Sermon.  If this does not resolve in several days, she will need GI follow-up with further stool studies.  Final Clinical Impression(s) / ED Diagnoses Final diagnoses:  None    Rx / DC Orders ED Discharge Orders    None       Drenda Freeze, MD 06/01/20 2321

## 2020-06-01 NOTE — Discharge Instructions (Signed)
Stay hydrated.  Eat starchy food that will help with diarrhea.  You may take Imodium up to 10 times a day.  Please take every time you have a diarrhea episode.  See your primary doctor and you may need to see your GI doctor if you have persistent diarrhea.  Return to ER if you have abdominal pain, vomiting, fever, dehydration.

## 2020-06-01 NOTE — ED Notes (Signed)
Pt is unable to give urine at the moment.

## 2020-06-01 NOTE — ED Triage Notes (Signed)
Pt had diarrhea for couple weeks. Got stool specimens and took to [facility] lab today. PCP sent medication to pharmacy but wasn't able to get it filled. PCP thinks pt has bacterial or viral infection and since couldn't get the medication PC advised to go to ED.

## 2020-06-02 LAB — GASTROINTESTINAL PANEL BY PCR, STOOL (REPLACES STOOL CULTURE)

## 2020-06-15 ENCOUNTER — Other Ambulatory Visit: Payer: Self-pay

## 2020-06-15 ENCOUNTER — Ambulatory Visit
Admission: RE | Admit: 2020-06-15 | Discharge: 2020-06-15 | Disposition: A | Discharge status: Home / Self Care | Payer: Medicare Other | Source: Ambulatory Visit | Attending: Family Medicine | Admitting: Family Medicine

## 2020-06-15 DIAGNOSIS — Z1231 Encounter for screening mammogram for malignant neoplasm of breast: Secondary | ICD-10-CM

## 2020-08-21 ENCOUNTER — Other Ambulatory Visit: Payer: Self-pay

## 2020-08-21 ENCOUNTER — Encounter (INDEPENDENT_AMBULATORY_CARE_PROVIDER_SITE_OTHER): Payer: Self-pay | Admitting: Ophthalmology

## 2020-08-21 ENCOUNTER — Ambulatory Visit (INDEPENDENT_AMBULATORY_CARE_PROVIDER_SITE_OTHER): Payer: Medicare Other | Admitting: Ophthalmology

## 2020-08-21 DIAGNOSIS — H43813 Vitreous degeneration, bilateral: Secondary | ICD-10-CM | POA: Diagnosis not present

## 2020-08-21 DIAGNOSIS — Z8669 Personal history of other diseases of the nervous system and sense organs: Secondary | ICD-10-CM | POA: Insufficient documentation

## 2020-08-21 DIAGNOSIS — H2513 Age-related nuclear cataract, bilateral: Secondary | ICD-10-CM | POA: Insufficient documentation

## 2020-08-21 NOTE — Patient Instructions (Signed)
Vitreous Detachment  Vitreous detachment is part of the normal aging process in the eyes. Vitreous is the jelly-like substance that makes up most of the inside of the eyeballs. It helps the eyeballs keep a round shape. The vitreous is attached to the retina of the eye with a series of fibers. As you age, the vitreous gradually shrinks. Tension increases between the fibers and the retina. Eventually, the fibers can break free from the retina, causing vitreous detachment. In most cases, this does not cause problems and does not require treatment. However, it can sometimes cause the retina to separate from the eyeball (retinal detachment), which requires treatment to prevent vision loss. What are the causes? Aging is the main cause of vitreous detachment. Everyone's vitreous naturally shrinks with age. What increases the risk? You are more likely to have vitreous detachment if you:  Are at least 73 years old.  Have inflammation of the eye.  Have an eye injury.  Have had eye surgery.  Have a hemorrhage in your eye.  Are very nearsighted (myopia).  Have diabetes. What are the signs or symptoms? Most people with this condition will not notice any symptoms. If symptoms do occur, the most common are floaters. Floaters occur as the vitreous begins to shrink. They may:  Appear as tiny dots or webs in your vision.  Seem to disappear when you look at them directly.  Appear more often as your condition gets worse. Other symptoms include:  Flashes of light (photopsia) in your peripheral vision that may look like lightning streaks.  Decreased vision or a dark curtain or shadow moving across your field of vision. This is rare. How is this diagnosed? This condition may be diagnosed based on:  Your signs and symptoms.  An exam by a health care provider who specializes in conditions and diseases of the eye (ophthalmologist). The exam may include: ? Putting eye drops in your eye to make the  pupil wider (dilated). The pupil is the opening in the center of the eye. ? Checking the pupils with a magnifying glass. This exam is the best way to determine the type and extent of damage to your eye. How is this treated? For most people, a vitreous detachment is harmless, causing no symptoms or vision loss, and does not require treatment. Floaters usually become less noticeable over time. If the condition causes retinal detachment, you may need eye surgery to reattach your retina (reattachment surgery) in order to prevent vision loss or restore your vision. Follow these instructions at home:  Keep all follow-up visits as told by your health care provider. This is important. Get help right away if:  You develop signs of retinal detachment. These include: ? A sudden increase in the number of floaters you see. ? An increase in the number of flashes of light you see in your peripheral vision. ? Decreased vision. Summary  Vitreous detachment is part of the normal aging process in the eyes.  Vitreous is the jelly-like substance inside the eyeballs. As you age, the vitreous shrinks, and the fibers that attach the vitreous to the retina can break free, causing vitreous detachment.  In most cases, vitreous detachment does not cause symptoms and does not require treatment. The most common symptom that can occur is seeing floaters that appear as tiny dots or webs in your vision.  Vitreous detachment can sometimes cause the retina to separate from the eyeball (retinal detachment). This must be treated to prevent vision loss. This information is not  intended to replace advice given to you by your health care provider. Make sure you discuss any questions you have with your health care provider. Document Revised: 11/07/2017 Document Reviewed: 10/16/2017 Elsevier Patient Education  2020 Elsevier Inc.  

## 2020-08-21 NOTE — Assessment & Plan Note (Signed)

## 2020-08-21 NOTE — Progress Notes (Signed)
08/21/2020     CHIEF COMPLAINT Patient presents for Retina Follow Up   HISTORY OF PRESENT ILLNESS: Dana Barr is a 74 y.o. female who presents to the clinic today for:   HPI    Retina Follow Up    Patient presents with  Other.  In both eyes.  This started 1 year ago.  Severity is mild.  Duration of 1 year.  Since onset it is stable.          Comments    1 Year F/U OU  Pt sts she noticed a slight decrease in [place], but sts she recently got a new prescription. Pt denies ocular pain, flashes of light, or floaters OU.         Last edited by Rockie Neighbours, [place] on 08/21/2020  8:05 AM. (History)      Referring physician: Carol Ada, MD [place],  [place] [id number]  HISTORICAL INFORMATION:   Selected notes from the [facility]: No current outpatient medications on file. (Ophthalmic Drugs)   No current facility-administered medications for this visit. (Ophthalmic Drugs)   Current Outpatient Medications (Other)  Medication Sig  . aspirin 81 MG chewable tablet Chew by mouth daily.  . Cholecalciferol 25 MCG (1000 UT) tablet Take 1,000 Units by mouth daily.   . metroNIDAZOLE (METROGEL) 0.75 % gel Apply 1 application topically 2 (two) times daily.   No current facility-administered medications for this visit. (Other)      REVIEW OF SYSTEMS:    ALLERGIES Allergies  Allergen Reactions  . Codeine Other (See Comments)    Hallucinations   . Predicort [Prednisolone] Other (See Comments)    Caused GERD  . Prednisone Other (See Comments)    Caused GERD    PAST MEDICAL HISTORY Past Medical History:  Diagnosis Date  . Cancer [facility]) 2000   Right breast cancer   . Personal history of radiation therapy 2000   Past Surgical History:  Procedure Laterality Date  . BREAST LUMPECTOMY Right 2000    FAMILY HISTORY History reviewed. No pertinent family history.  SOCIAL HISTORY Social  History   Tobacco Use  . Smoking status: Never Smoker  . Smokeless tobacco: Never Used  Substance Use Topics  . Alcohol use: Never  . Drug use: Never         OPHTHALMIC EXAM:  Base Eye Exam    Visual Acuity (ETDRS)      Right Left   Dist cc 20/25 -2 20/20 -2   Correction: Glasses       Tonometry (Tonopen, 8:06 AM)      Right Left   Pressure 16 17       Pupils      Pupils Dark Light Shape React APD   Right PERRL 4 3 Round Brisk None   Left PERRL 4 3 Round Brisk None       Visual Fields (Counting fingers)      Left Right    Full Full       Extraocular Movement      Right Left    Full Full       Neuro/Psych    Oriented x3: Yes   Mood/Affect: Normal       Dilation    Both eyes: 1.0% Mydriacyl, 2.5% Phenylephrine @ 8:09 AM        Slit Lamp and Fundus Exam    External Exam  Right Left   External Normal Normal       Slit Lamp Exam      Right Left   Lids/Lashes Normal Normal   Conjunctiva/Sclera White and quiet White and quiet   Cornea Clear Clear   Anterior Chamber PI at 12 PI at 1   Iris Round and reactive Round and reactive   Lens Nuclear sclerosis Nuclear sclerosis   Anterior Vitreous Normal Normal       Fundus Exam      Right Left   Posterior Vitreous Posterior vitreous detachment Normal   Disc Normal Normal   C/D Ratio 0.5 0.3   Macula Normal Normal   Vessels Normal Normal   Periphery good pexy sn and st quads, no breaks Good cryopexy inferotemporal quadrant left eye          IMAGING AND PROCEDURES  Imaging and Procedures for 08/21/20           ASSESSMENT/PLAN:  Nuclear sclerotic cataract of both eyes The nature of cataract was discussed with the patient as well as the elective nature of surgery. The patient was reassured that surgery at a later date does not put the patient at risk for a worse outcome. It was emphasized that the need for surgery is dictated by the patient's quality of life as influenced by the cataract.  Patient was instructed to maintain close follow up with their general eye care doctor.      ICD-10-CM   1. History of retinal detachment  Z86.69   2. Posterior vitreous detachment of both eyes  H43.813   3. Nuclear sclerotic cataract of both eyes  H25.13     1.  2.  3.  Ophthalmic Meds Ordered this visit:  No orders of the defined types were placed in this encounter.      Return in about 1 year (around 08/21/2021) for DILATE OU, OCT.  Patient Instructions  Vitreous Detachment  Vitreous detachment is part of the normal aging process in the eyes. Vitreous is the jelly-like substance that makes up most of the inside of the eyeballs. It helps the eyeballs keep a round shape. The vitreous is attached to the retina of the eye with a series of fibers. As you age, the vitreous gradually shrinks. Tension increases between the fibers and the retina. Eventually, the fibers can break free from the retina, causing vitreous detachment. In most cases, this does not cause problems and does not require treatment. However, it can sometimes cause the retina to separate from the eyeball (retinal detachment), which requires treatment to prevent vision loss. What are the causes? Aging is the main cause of vitreous detachment. Everyone's vitreous naturally shrinks with age. What increases the risk? You are more likely to have vitreous detachment if you:  Are at least 73 years old.  Have inflammation of the eye.  Have an eye injury.  Have had eye surgery.  Have a hemorrhage in your eye.  Are very nearsighted (myopia).  Have diabetes. What are the signs or symptoms? Most people with this condition will not notice any symptoms. If symptoms do occur, the most common are floaters. Floaters occur as the vitreous begins to shrink. They may:  Appear as tiny dots or webs in your vision.  Seem to disappear when you look at them directly.  Appear more often as your condition gets worse. Other  symptoms include:  Flashes of light (photopsia) in your peripheral vision that may look like lightning streaks.  Decreased vision or  a dark curtain or shadow moving across your field of vision. This is rare. How is this diagnosed? This condition may be diagnosed based on:  Your signs and symptoms.  An exam by a health care provider who specializes in conditions and diseases of the eye (ophthalmologist). The exam may include: ? Putting eye drops in your eye to make the pupil wider (dilated). The pupil is the opening in the center of the eye. ? Checking the pupils with a magnifying glass. This exam is the best way to determine the type and extent of damage to your eye. How is this treated? For most people, a vitreous detachment is harmless, causing no symptoms or vision loss, and does not require treatment. Floaters usually become less noticeable over time. If the condition causes retinal detachment, you may need eye surgery to reattach your retina (reattachment surgery) in order to prevent vision loss or restore your vision. Follow these instructions at home:  Keep all follow-up visits as told by your health care provider. This is important. Get help right away if:  You develop signs of retinal detachment. These include: ? A sudden increase in the number of floaters you see. ? An increase in the number of flashes of light you see in your peripheral vision. ? Decreased vision. Summary  Vitreous detachment is part of the normal aging process in the eyes.  Vitreous is the jelly-like substance inside the eyeballs. As you age, the vitreous shrinks, and the fibers that attach the vitreous to the retina can break free, causing vitreous detachment.  In most cases, vitreous detachment does not cause symptoms and does not require treatment. The most common symptom that can occur is seeing floaters that appear as tiny dots or webs in your vision.  Vitreous detachment can sometimes cause the  retina to separate from the eyeball (retinal detachment). This must be treated to prevent vision loss. This information is not intended to replace advice given to you by your health care provider. Make sure you discuss any questions you have with your health care provider. Document Revised: 11/07/2017 Document Reviewed: 10/16/2017 Elsevier Patient Education  2020 [employer].     Explained the diagnoses, plan, and follow up with the patient and they expressed understanding.  Patient expressed understanding of the importance of proper follow up care.   Clent Demark Shere Eisenhart M.D. Diseases & Surgery of the Retina and Vitreous Retina & Diabetic [place] 08/21/20     Abbreviations: M myopia (nearsighted); A astigmatism; H hyperopia (farsighted); P presbyopia; Mrx spectacle prescription;  CTL contact lenses; OD right eye; OS left eye; OU both eyes  XT exotropia; ET esotropia; PEK punctate epithelial keratitis; PEE punctate epithelial erosions; DES dry eye syndrome; MGD meibomian gland dysfunction; ATs artificial tears; PFAT's preservative free artificial tears; [place] nuclear sclerotic cataract; PSC posterior subcapsular cataract; ERM epi-retinal membrane; PVD posterior vitreous detachment; RD retinal detachment; DM diabetes mellitus; DR diabetic retinopathy; NPDR non-proliferative diabetic retinopathy; PDR proliferative diabetic retinopathy; CSME clinically significant macular edema; DME diabetic macular edema; dbh dot blot hemorrhages; CWS cotton wool spot; POAG primary open angle glaucoma; C/D cup-to-disc ratio; HVF humphrey visual field; GVF goldmann visual field; OCT optical coherence tomography; IOP intraocular pressure; BRVO Branch retinal vein occlusion; CRVO central retinal vein occlusion; CRAO central retinal artery occlusion; BRAO branch retinal artery occlusion; RT retinal tear; SB scleral buckle; PPV pars plana vitrectomy; VH Vitreous hemorrhage; PRP panretinal laser photocoagulation; IVK  intravitreal kenalog; VMT vitreomacular traction; MH Macular hole;  NVD neovascularization  of the disc; NVE neovascularization elsewhere; AREDS age related eye disease study; ARMD age related macular degeneration; POAG primary open angle glaucoma; EBMD epithelial/anterior basement membrane dystrophy; ACIOL anterior chamber intraocular lens; IOL intraocular lens; PCIOL posterior chamber intraocular lens; Phaco/IOL phacoemulsification with intraocular lens placement; [place] photorefractive keratectomy; LASIK laser assisted in situ keratomileusis; HTN hypertension; DM diabetes mellitus; COPD chronic obstructive pulmonary disease

## 2020-09-25 ENCOUNTER — Other Ambulatory Visit: Payer: Self-pay

## 2020-09-25 ENCOUNTER — Encounter: Payer: Medicare Other | Attending: Gastroenterology | Admitting: Dietician

## 2020-09-25 DIAGNOSIS — R197 Diarrhea, unspecified: Secondary | ICD-10-CM | POA: Insufficient documentation

## 2020-09-25 DIAGNOSIS — K588 Other irritable bowel syndrome: Secondary | ICD-10-CM | POA: Insufficient documentation

## 2020-09-25 DIAGNOSIS — Z713 Dietary counseling and surveillance: Secondary | ICD-10-CM | POA: Diagnosis not present

## 2020-09-25 NOTE — Progress Notes (Signed)
Medical Nutrition Therapy:  Appt start time: 0086 end time:  [id number].   Assessment:  Primary concerns today: .  Patient is here today alone. She was referred due to diarrhea. She has questions regarding what she should eat usually and if she has a flair up what should she eat.  Per patient she had a Bacterial/viral infection in the past with resulting diarrhea due to increased inflammation.   August 2021 severe flair of diarrhea and was admitted to the hospital. She was sent home from the hospital on a diet consisting of white bread, white rice, canned vegetables, canned fruit, low fat dairy (high starch, low fiber, low fat diet).  Diarrhea stopped in 3 days. Colonoscopy showed:  Lymphocytic, microscopic colitis.  Treatment is steroids but does not tolerate well.  Patient wishes to treat naturally if possible. Celiac test was negative She is taking a probiotic:  Renew Life (lactobacillus) Diarrhea:  soft stool most of the time, occasionally loose watery stool, occasional watery diarrhea with 3 flair ups since July  Weight hx: 114 lbs per patient 09/25/2020 IBS 02/2020 and weight reduced to under 100 lbs. UBW 115 lbs as an older woman UBW 125 lbs as a younger woman  Patient lives with her son, daughter-in-law, and grandchild.  Patient does the shopping and cooking.   She is a retired Secretary/administrator.   Preferred Learning Style:   No preference indicated   Learning Readiness:   Ready  Change in progress   MEDICATIONS: see list to include probiotic, vitamin D   DIETARY INTAKE: Ate a high fiber, Mediterranean diet for years (salmon or tuna twice per week, chicken once per week, or vegetarian the rest of the week)  Avoided foods include raw vegetables and lettuce due to diarrhea. Flair with spinach or hummus.  She is scared to try them. Tolerated, beets, carrots, zucchini, broccoli, green beans, Brussels sprouts Never tolerated cabbage (slaw or cooked).  24-hr recall:  B ( AM): homemade  granola, whole grain cheerios, nonfat yogurt, banana or blueberries, LF lactaid milk OR soy milk, [employer] toast, sunflower seed butter, small glass cranberry juice, black decaf coffee   Snk ( AM): none  L ( PM): grilled cheese sandwich (WW bread, LF cheese, pimento's, mayo, mustard (no butter), 1/2 piece fruit Snk ( PM): occasional graham crackers, sunbutter, soy milk or cheese stick D ( PM): fish, OR pasta, zucchini, spinach (diarrhea the next day all day), Snk ( PM): LF frozen yogurt or ice cream with caramel sauce, 2 cookies or granola Beverages: water, black decaf coffee, cranberry juice, LF lactaid milk or soymilk  Usual physical activity: yoga (Monday), [employer] on line 2-3 times per week (strength, cardio, flexability) on line. [place], cares for her 11 month old granddaughter.    Estimated energy needs: 1600 calories 50-60 g protein  Progress Towards Goal(s):  In progress.   Nutritional Diagnosis:  NB-1.1 Food and nutrition-related knowledge deficit As related to food and diarrhea.  As evidenced by diet hx and patient report.    Intervention:  Nutrition education regarding what to do in the event of an acute flair of diarrhea and how to increase diet to one which is higher in fiber.  We discussed foods that are tolerated and less tolerated.  We discussed certain ingredients in processed foods that may contribute.    Plan: When having acute diarrhea:  Stay hydrated Try if tolerated sip on:  Sometimes sugar containing beverages can cause diarrhea (sugar for some)  Pedialyte or Gatorade  Broth  water  Regular jello   Regular soda (sprite, 7 up, Gingerale)  Cranberry juice  Herb tea  Clear liquid supplement Call MD if diarrhea persists.  Add the white diet back slowly to see how you tolerate:  White rice, white toast, saltine crackers  Canned fruit if tolerated  Plain boiled white potato (no skin)  When tolerated see the list of recommended foods on the IBD list And slowly  increase your diet to "normal" Introduce 1 new food every 3 days or so in small amounts to determine tolerance.  Avoid those that you do not tolerate. Limit raw vegetables for now.  Avoid the following:  Sugar alcohol (sugar alcohols end with -ol on the label such as sorbitol, erythritol, etc)  Inulin  Certain Emulsifiers  [place]  Continue the probiotic.  Make a list of foods that you tolerate and another list of foods that you don't tolerate.  Teaching Method Utilized:  Auditory  Handouts given during visit include:  IBD nutrition therapy from [facility]  Samples of Ensure clear and Protein20  Barriers to learning/adherence to lifestyle change: none  Demonstrated degree of understanding via:  Teach Back   Monitoring/Evaluation:  Dietary intake, exercise, and body weight prn.

## 2020-09-25 NOTE — Patient Instructions (Addendum)
Plan: When having acute diarrhea:  Stay hydrated Try if tolerated sip on:  Sometimes sugar containing beverages can cause diarrhea (sugar for some)  Pedialyte or Gatorade  Broth  water  Regular jello   Regular soda (sprite, 7 up, Gingerale)  Cranberry juice  Herb tea  Clear liquid supplement Call MD if diarrhea persists.  Add the white diet back slowly to see how you tolerate:  White rice, white toast, saltine crackers  Canned fruit if tolerated  Plain boiled white potato (no skin)  When tolerated see the list of recommended foods on the IBD list And slowly increase your diet to "normal" Introduce 1 new food every 3 days or so in small amounts to determine tolerance.  Avoid those that you do not tolerate. Limit raw vegetables for now.  Avoid the following:  Sugar alcohol (sugar alcohols end with -ol on the label such as sorbitol, erythritol, etc)  Inulin  Certain Emulsifiers  Carrageenan  Continue the probiotic.  Make a list of foods that you tolerate and another list of foods that you don't tolerate.

## 2021-01-22 DIAGNOSIS — D1801 Hemangioma of skin and subcutaneous tissue: Secondary | ICD-10-CM | POA: Diagnosis not present

## 2021-01-22 DIAGNOSIS — Q828 Other specified congenital malformations of skin: Secondary | ICD-10-CM | POA: Diagnosis not present

## 2021-01-22 DIAGNOSIS — L814 Other melanin hyperpigmentation: Secondary | ICD-10-CM | POA: Diagnosis not present

## 2021-01-22 DIAGNOSIS — L603 Nail dystrophy: Secondary | ICD-10-CM | POA: Diagnosis not present

## 2021-01-22 DIAGNOSIS — L821 Other seborrheic keratosis: Secondary | ICD-10-CM | POA: Diagnosis not present

## 2021-01-22 DIAGNOSIS — L7211 Pilar cyst: Secondary | ICD-10-CM | POA: Diagnosis not present

## 2021-01-22 DIAGNOSIS — D1723 Benign lipomatous neoplasm of skin and subcutaneous tissue of right leg: Secondary | ICD-10-CM | POA: Diagnosis not present

## 2021-01-22 DIAGNOSIS — L718 Other rosacea: Secondary | ICD-10-CM | POA: Diagnosis not present

## 2021-01-22 DIAGNOSIS — D485 Neoplasm of uncertain behavior of skin: Secondary | ICD-10-CM | POA: Diagnosis not present

## 2021-01-22 DIAGNOSIS — L308 Other specified dermatitis: Secondary | ICD-10-CM | POA: Diagnosis not present

## 2021-01-22 DIAGNOSIS — L570 Actinic keratosis: Secondary | ICD-10-CM | POA: Diagnosis not present

## 2021-02-15 ENCOUNTER — Other Ambulatory Visit: Payer: Self-pay

## 2021-02-15 ENCOUNTER — Ambulatory Visit (INDEPENDENT_AMBULATORY_CARE_PROVIDER_SITE_OTHER): Payer: Medicare Other | Admitting: Ophthalmology

## 2021-02-15 ENCOUNTER — Encounter (INDEPENDENT_AMBULATORY_CARE_PROVIDER_SITE_OTHER): Payer: Self-pay | Admitting: Ophthalmology

## 2021-02-15 DIAGNOSIS — H2513 Age-related nuclear cataract, bilateral: Secondary | ICD-10-CM

## 2021-02-15 DIAGNOSIS — H43813 Vitreous degeneration, bilateral: Secondary | ICD-10-CM

## 2021-02-15 DIAGNOSIS — G43109 Migraine with aura, not intractable, without status migrainosus: Secondary | ICD-10-CM | POA: Diagnosis not present

## 2021-02-15 NOTE — Assessment & Plan Note (Signed)

## 2021-02-15 NOTE — Assessment & Plan Note (Signed)
No new retinal breaks 

## 2021-02-15 NOTE — Progress Notes (Signed)
02/15/2021     CHIEF COMPLAINT Patient presents for Retina Follow Up (5 Month PVD f\u OU. OCT/Pt c/o a "pixel sized light that zipped across OD vision". Pt states this happened twice on Monday and twice on Wednesday. Denies any other symptoms. H/o RD OU)   HISTORY OF PRESENT ILLNESS: Dana Barr is a 75 y.o. female who presents to the clinic today for:   HPI    Retina Follow Up    Patient presents with  PVD.  In both eyes.  Severity is moderate.  Duration of 5 months.  Since onset it is stable.  I, the attending physician,  performed the HPI with the patient and updated documentation appropriately. Additional comments: 5 Month PVD f\u OU. OCT Pt c/o a "pixel sized light that zipped across OD vision". Pt states this happened twice on Monday and twice on Wednesday. Denies any other symptoms. H/o RD OU       Last edited by Tilda Franco on 02/15/2021  8:32 AM. (History)      Referring physician: Carol Ada, MD [address] [place],  [place] [id number]  HISTORICAL INFORMATION:   Selected notes from the [facility]: No current outpatient medications on file. (Ophthalmic Drugs)   No current facility-administered medications for this visit. (Ophthalmic Drugs)   Current Outpatient Medications (Other)  Medication Sig  . aspirin 81 MG chewable tablet Chew by mouth daily.  . Cholecalciferol 25 MCG (1000 UT) tablet Take 1,000 Units by mouth daily.   Marland Kitchen LACTOBACILLUS PO Take by mouth.  . metroNIDAZOLE (METROGEL) 0.75 % gel Apply 1 application topically 2 (two) times daily.   No current facility-administered medications for this visit. (Other)      REVIEW OF SYSTEMS:    ALLERGIES Allergies  Allergen Reactions  . Codeine Other (See Comments)    Hallucinations   . Predicort [Prednisolone] Other (See Comments)    Caused GERD  . Prednisone Other (See Comments)    Caused GERD    PAST MEDICAL HISTORY Past  Medical History:  Diagnosis Date  . Cancer [facility]) 2000   Right breast cancer   . Personal history of radiation therapy 2000   Past Surgical History:  Procedure Laterality Date  . BREAST LUMPECTOMY Right 2000    FAMILY HISTORY History reviewed. No pertinent family history.  SOCIAL HISTORY Social History   Tobacco Use  . Smoking status: Never Smoker  . Smokeless tobacco: Never Used  Substance Use Topics  . Alcohol use: Never  . Drug use: Never         OPHTHALMIC EXAM:  Base Eye Exam    Visual Acuity (Snellen - Linear)      Right Left   Dist [place] 20/20 20/30   Dist ph [place]  20/25       Tonometry (Tonopen, 8:35 AM)      Right Left   Pressure 15 15       Pupils      Pupils Dark Light Shape React APD   Right PERRL 3 3 Round Minimal None   Left PERRL 3 3 Round Minimal None       Visual Fields (Counting fingers)      Left Right    Full Full       Neuro/Psych    Oriented x3: Yes   Mood/Affect: Normal       Dilation    Both eyes: 1.0% Mydriacyl, 2.5%  Phenylephrine @ 8:35 AM        Slit Lamp and Fundus Exam    External Exam      Right Left   External Normal Normal       Slit Lamp Exam      Right Left   Lids/Lashes Normal Normal   Conjunctiva/Sclera White and quiet White and quiet   Cornea Clear Clear   Anterior Chamber PI at 12 PI at 1   Iris Round and reactive Round and reactive   Lens Nuclear sclerosis Nuclear sclerosis   Anterior Vitreous Normal Normal       Fundus Exam      Right Left   Posterior Vitreous Posterior vitreous detachment Normal   Disc small cilioretinal artery, Small cilioretinal artery to the nasal aspect of the p.m. bundle Normal   C/D Ratio 0.5 0.3   Macula Normal Normal   Vessels Normal Normal   Periphery good pexy sn and st quads, no breaks no new retinal breaks, good view to the ora serrata Good cryopexy inferotemporal quadrant left eye,          IMAGING AND PROCEDURES  Imaging and Procedures for 02/15/21  OCT,  Retina - OU - Both Eyes       Right Eye Quality was good. Scan locations included subfoveal. Central Foveal Thickness: 307. Progression has been stable.   Left Eye Quality was good. Scan locations included subfoveal. Central Foveal Thickness: 302. Progression has been stable.   Notes Posterior vitreous detachment OU.,, NO ACTIVE MACULOPATHY                ASSESSMENT/PLAN:  Posterior vitreous detachment of both eyes No new retinal breaks  Nuclear sclerotic cataract of both eyes The nature of cataract was discussed with the patient as well as the elective nature of surgery. The patient was reassured that surgery at a later date does not put the patient at risk for a worse outcome. It was emphasized that the need for surgery is dictated by the patient's quality of life as influenced by the cataract. Patient was instructed to maintain close follow up with their general eye care doctor.      ICD-10-CM   1. Posterior vitreous detachment of both eyes  H43.813 OCT, Retina - OU - Both Eyes  2. Nuclear sclerotic cataract of both eyes  H25.13   3. Ophthalmic migraine  G43.109     1.  Flashing lights in the right eye horizontal in nature, there is no pathologic endings on either eye today there are no retinal holes or tears.  There is no retinal vasculopathy either.  2.Monocular testing if symptoms recur  3.  Ophthalmic Meds Ordered this visit:  No orders of the defined types were placed in this encounter.      Return for As scheduled, COLOR FP, DILATE OU.  There are no Patient Instructions on file for this visit.   Explained the diagnoses, plan, and follow up with the patient and they expressed understanding.  Patient expressed understanding of the importance of proper follow up care.   Clent Demark Chrishawn Kring M.D. Diseases & Surgery of the Retina and Vitreous Retina & Diabetic [place] 02/15/21     Abbreviations: M myopia (nearsighted); A astigmatism; H hyperopia  (farsighted); P presbyopia; Mrx spectacle prescription;  CTL contact lenses; OD right eye; OS left eye; OU both eyes  XT exotropia; ET esotropia; PEK punctate epithelial keratitis; PEE punctate epithelial erosions; DES dry eye syndrome; MGD meibomian gland dysfunction;  ATs artificial tears; PFAT's preservative free artificial tears; [place] nuclear sclerotic cataract; PSC posterior subcapsular cataract; ERM epi-retinal membrane; PVD posterior vitreous detachment; RD retinal detachment; DM diabetes mellitus; DR diabetic retinopathy; NPDR non-proliferative diabetic retinopathy; PDR proliferative diabetic retinopathy; CSME clinically significant macular edema; DME diabetic macular edema; dbh dot blot hemorrhages; CWS cotton wool spot; POAG primary open angle glaucoma; C/D cup-to-disc ratio; HVF humphrey visual field; GVF goldmann visual field; OCT optical coherence tomography; IOP intraocular pressure; BRVO Branch retinal vein occlusion; CRVO central retinal vein occlusion; CRAO central retinal artery occlusion; BRAO branch retinal artery occlusion; RT retinal tear; SB scleral buckle; PPV pars plana vitrectomy; VH Vitreous hemorrhage; PRP panretinal laser photocoagulation; IVK intravitreal kenalog; VMT vitreomacular traction; MH Macular hole;  NVD neovascularization of the disc; NVE neovascularization elsewhere; AREDS age related eye disease study; ARMD age related macular degeneration; POAG primary open angle glaucoma; EBMD epithelial/anterior basement membrane dystrophy; ACIOL anterior chamber intraocular lens; IOL intraocular lens; PCIOL posterior chamber intraocular lens; Phaco/IOL phacoemulsification with intraocular lens placement; [place] photorefractive keratectomy; LASIK laser assisted in situ keratomileusis; HTN hypertension; DM diabetes mellitus; COPD chronic obstructive pulmonary disease

## 2021-02-22 DIAGNOSIS — Z0000 Encounter for general adult medical examination without abnormal findings: Secondary | ICD-10-CM | POA: Diagnosis not present

## 2021-02-22 DIAGNOSIS — E782 Mixed hyperlipidemia: Secondary | ICD-10-CM | POA: Diagnosis not present

## 2021-02-22 DIAGNOSIS — Z1389 Encounter for screening for other disorder: Secondary | ICD-10-CM | POA: Diagnosis not present

## 2021-03-26 ENCOUNTER — Other Ambulatory Visit: Payer: Self-pay | Admitting: Family Medicine

## 2021-03-26 DIAGNOSIS — Z1231 Encounter for screening mammogram for malignant neoplasm of breast: Secondary | ICD-10-CM

## 2021-04-24 DIAGNOSIS — L905 Scar conditions and fibrosis of skin: Secondary | ICD-10-CM | POA: Diagnosis not present

## 2021-04-30 DIAGNOSIS — K52832 Lymphocytic colitis: Secondary | ICD-10-CM | POA: Diagnosis not present

## 2021-04-30 DIAGNOSIS — R197 Diarrhea, unspecified: Secondary | ICD-10-CM | POA: Diagnosis not present

## 2021-06-18 ENCOUNTER — Ambulatory Visit: Payer: Medicare Other

## 2021-06-26 DIAGNOSIS — H0100B Unspecified blepharitis left eye, upper and lower eyelids: Secondary | ICD-10-CM | POA: Diagnosis not present

## 2021-06-26 DIAGNOSIS — H52223 Regular astigmatism, bilateral: Secondary | ICD-10-CM | POA: Diagnosis not present

## 2021-06-26 DIAGNOSIS — H33313 Horseshoe tear of retina without detachment, bilateral: Secondary | ICD-10-CM | POA: Diagnosis not present

## 2021-06-26 DIAGNOSIS — Z83511 Family history of glaucoma: Secondary | ICD-10-CM | POA: Diagnosis not present

## 2021-06-26 DIAGNOSIS — H5203 Hypermetropia, bilateral: Secondary | ICD-10-CM | POA: Diagnosis not present

## 2021-06-26 DIAGNOSIS — H2513 Age-related nuclear cataract, bilateral: Secondary | ICD-10-CM | POA: Diagnosis not present

## 2021-06-26 DIAGNOSIS — H0100A Unspecified blepharitis right eye, upper and lower eyelids: Secondary | ICD-10-CM | POA: Diagnosis not present

## 2021-06-26 DIAGNOSIS — H43813 Vitreous degeneration, bilateral: Secondary | ICD-10-CM | POA: Diagnosis not present

## 2021-06-26 DIAGNOSIS — H524 Presbyopia: Secondary | ICD-10-CM | POA: Diagnosis not present

## 2021-06-26 DIAGNOSIS — H16223 Keratoconjunctivitis sicca, not specified as Sjogren's, bilateral: Secondary | ICD-10-CM | POA: Diagnosis not present

## 2021-06-26 DIAGNOSIS — H40033 Anatomical narrow angle, bilateral: Secondary | ICD-10-CM | POA: Diagnosis not present

## 2021-07-03 ENCOUNTER — Other Ambulatory Visit: Payer: Self-pay

## 2021-07-03 ENCOUNTER — Ambulatory Visit
Admission: RE | Admit: 2021-07-03 | Discharge: 2021-07-03 | Disposition: A | Discharge status: Home / Self Care | Payer: Medicare Other | Source: Ambulatory Visit | Attending: Family Medicine | Admitting: Family Medicine

## 2021-07-03 DIAGNOSIS — Z1231 Encounter for screening mammogram for malignant neoplasm of breast: Secondary | ICD-10-CM

## 2021-07-30 IMAGING — CT CT ABD-PELV W/ CM
2 of 5 series · 15 of 46 positions shown, 17 images · IV contrast (omnipaque)
Comparison: CT abdomen pelvis dated 05/01/2018.

CLINICAL DATA: 73-year-old female with abdominal distension and
diarrhea.

EXAM:
CT ABDOMEN AND PELVIS WITH CONTRAST
TECHNIQUE: Multidetector CT imaging of the abdomen and pelvis was performed
using the standard protocol following bolus administration of
intravenous contrast.
CONTRAST:  80mL OMNIPAQUE IOHEXOL 300 MG/ML  SOLN

[Series 2: axial st · axial · 0.68mm/px · z∈[+996,+1351]mm · 12 of 85 slices shown, 14 images]
[im 7/85  soft-tissue]
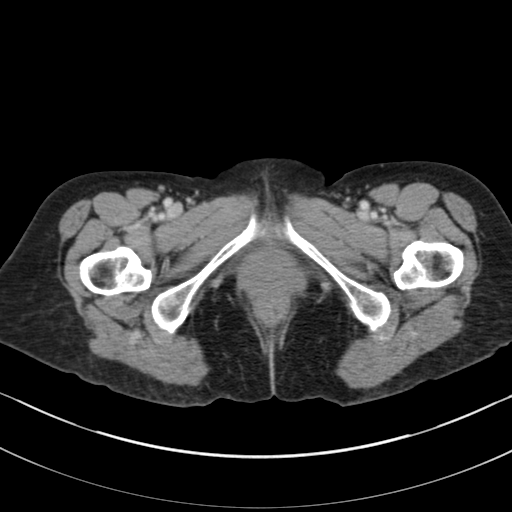
[im 7/85  bone]
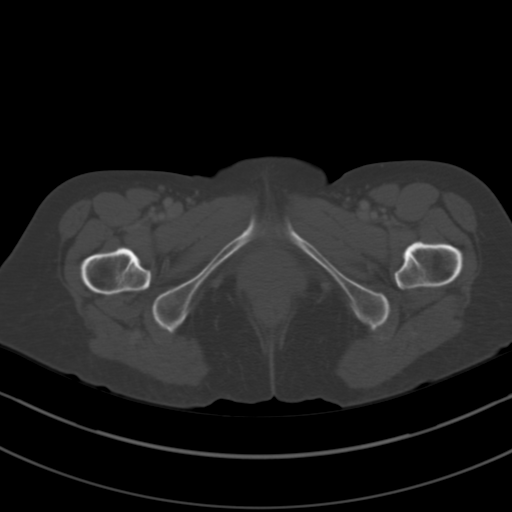
[im 13/85  soft-tissue]
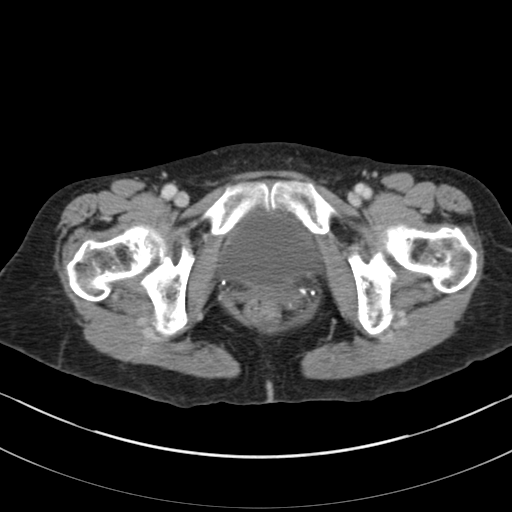
[im 20/85  soft-tissue]
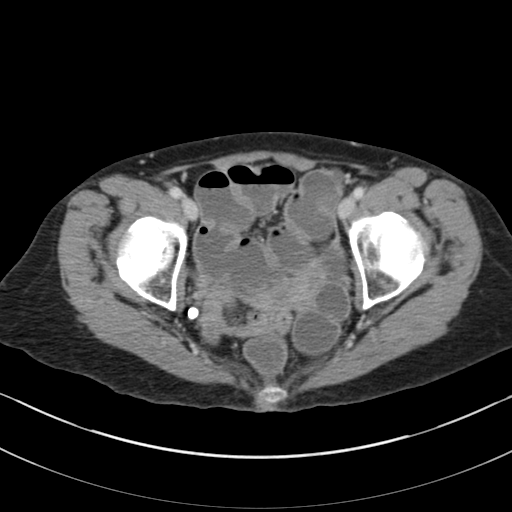
[im 26/85  soft-tissue]
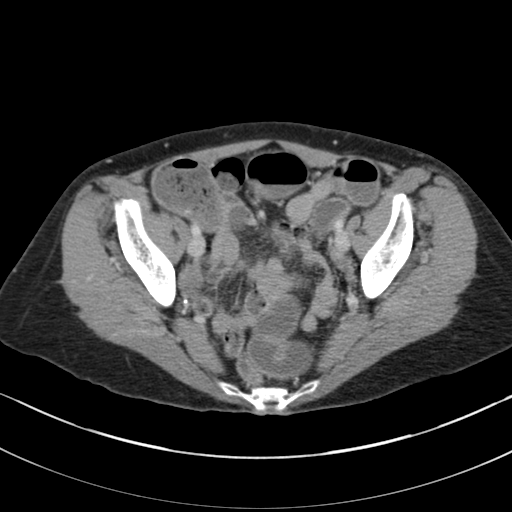
[im 33/85  soft-tissue]
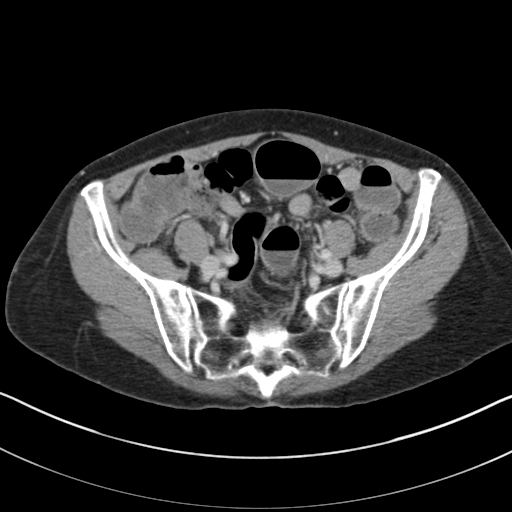
[im 39/85  soft-tissue]
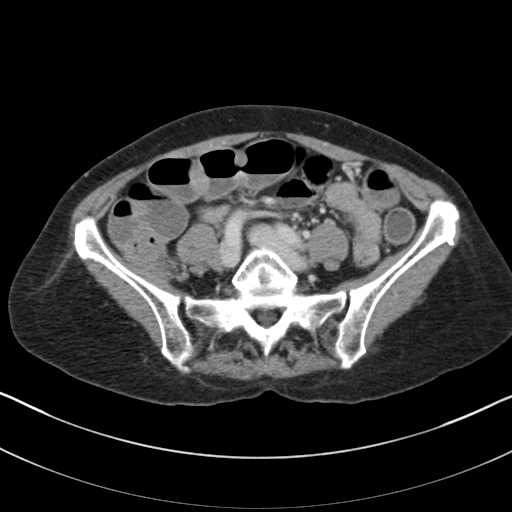
[im 46/85  soft-tissue]
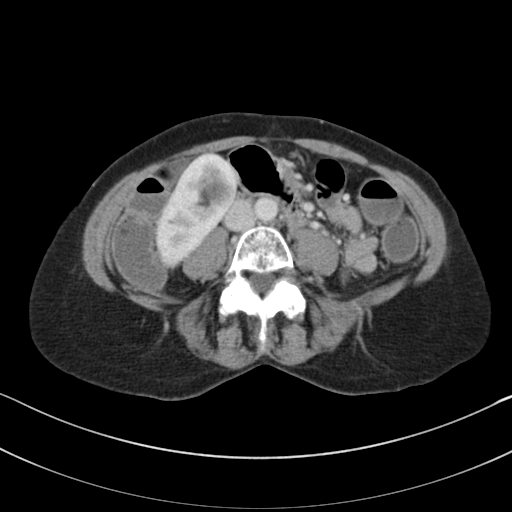
[im 52/85  soft-tissue]
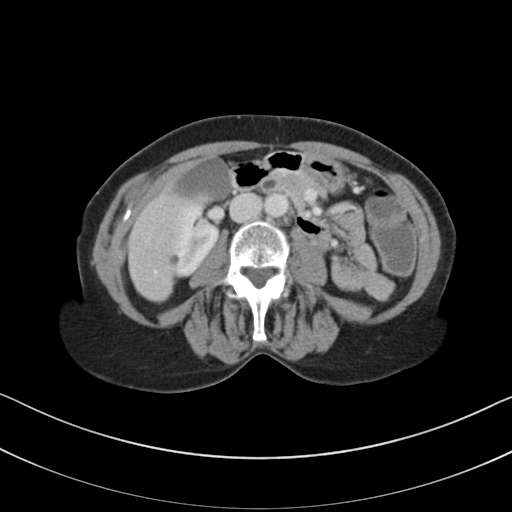
[im 59/85  soft-tissue]
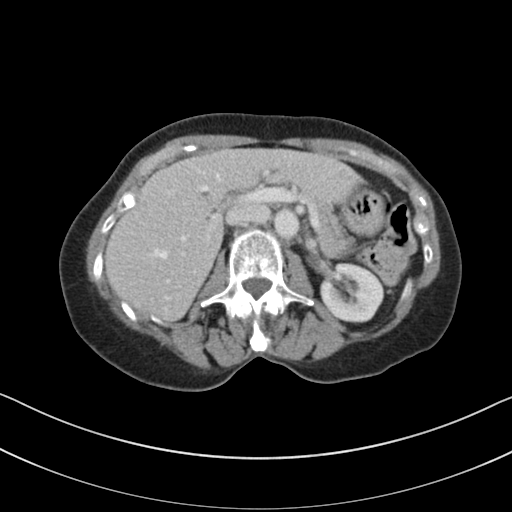
[im 59/85  bone]
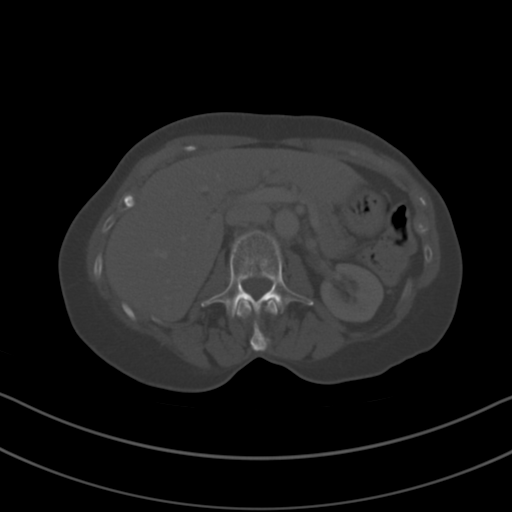
[im 65/85  soft-tissue]
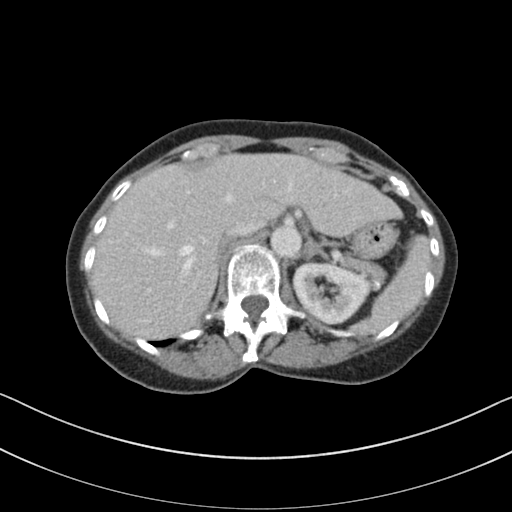
[im 72/85  soft-tissue]
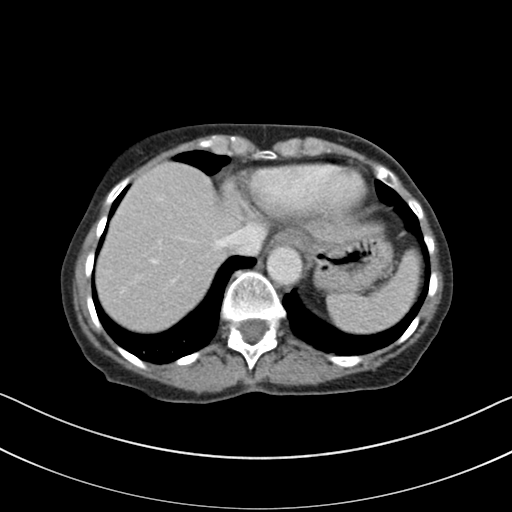
[im 78/85  soft-tissue]
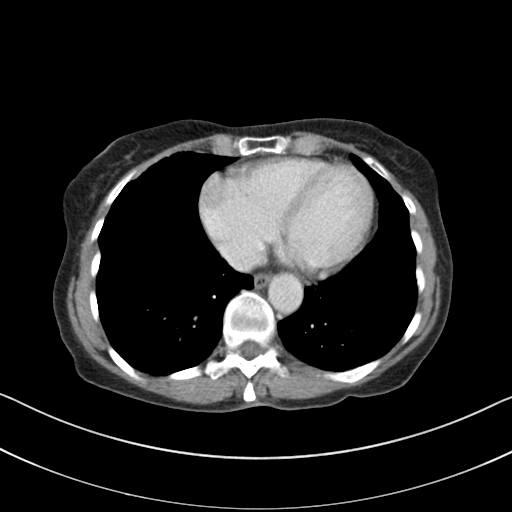

[Series 5: coronal st · coronal · 0.61mm/px · 3 of 109 slices shown]
[im 37/109  soft-tissue]
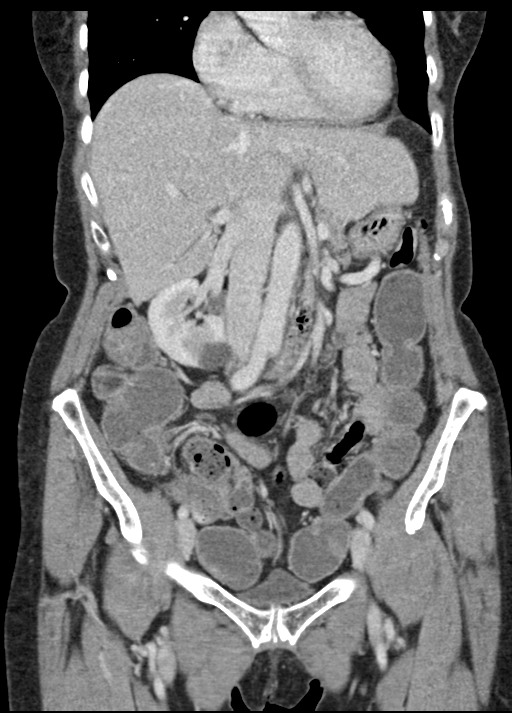
[im 49/109  soft-tissue]
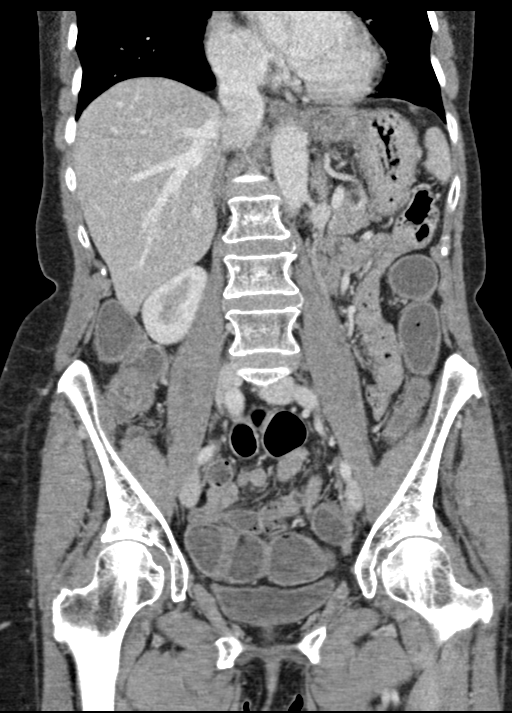
[im 61/109  soft-tissue]
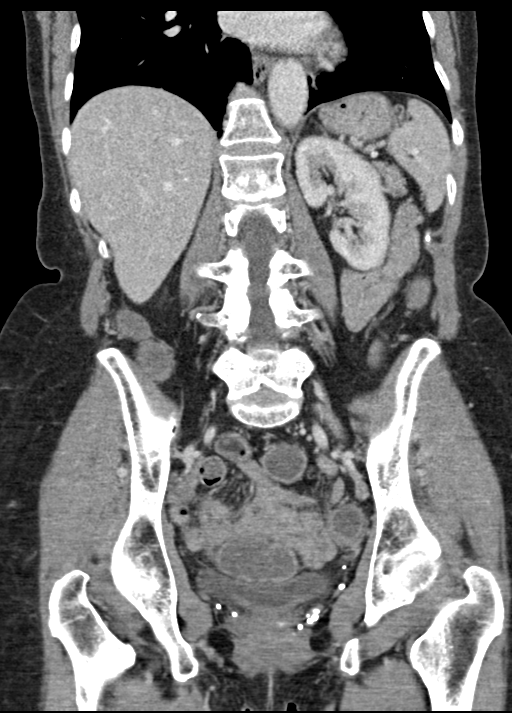

[15 of 46 positions shown; findings below may reference images not displayed]

FINDINGS: Lower chest: The visualized lung bases are clear.

No intra-abdominal free air or free fluid.

Hepatobiliary: No focal liver abnormality is seen. No gallstones,
gallbladder wall thickening, or biliary dilatation.

Pancreas: Unremarkable. No pancreatic ductal dilatation or
surrounding inflammatory changes.

Spleen: Normal in size without focal abnormality.

Adrenals/Urinary Tract: The adrenal glands are unremarkable. There
is no hydronephrosis on either side. There is symmetric enhancement
and excretion of contrast by both kidneys. There is non rotation of
the right kidney. There is a 3.5 cm right renal inferior pole cyst
and a subcentimeter right renal upper pole hypodense lesion which is
too small to characterize. The visualized ureters and urinary
bladder appear unremarkable.

Stomach/Bowel: There is loose stool throughout the colon compatible
with diarrheal state. Correlation with clinical exam and stool
cultures recommended. There is no bowel obstruction or active
inflammation. The appendix is not visualized with certainty. No
inflammatory changes identified in the right lower quadrant.

Vascular/Lymphatic: The abdominal aorta and IVC unremarkable. No
portal venous gas. There is no adenopathy.

Reproductive: The uterus is suboptimally visualized. No adnexal
masses.

Other: Biopsy clips in the right breast.

Musculoskeletal: Osteopenia. No acute osseous pathology.
IMPRESSION: Diarrheal state. Correlation with clinical exam and stool cultures
recommended. No bowel obstruction.

## 2021-08-21 ENCOUNTER — Encounter (INDEPENDENT_AMBULATORY_CARE_PROVIDER_SITE_OTHER): Payer: Self-pay | Admitting: Ophthalmology

## 2021-08-21 ENCOUNTER — Ambulatory Visit (INDEPENDENT_AMBULATORY_CARE_PROVIDER_SITE_OTHER): Payer: Medicare Other | Admitting: Ophthalmology

## 2021-08-21 ENCOUNTER — Other Ambulatory Visit: Payer: Self-pay

## 2021-08-21 DIAGNOSIS — Z8669 Personal history of other diseases of the nervous system and sense organs: Secondary | ICD-10-CM

## 2021-08-21 DIAGNOSIS — H2513 Age-related nuclear cataract, bilateral: Secondary | ICD-10-CM | POA: Diagnosis not present

## 2021-08-21 DIAGNOSIS — H43813 Vitreous degeneration, bilateral: Secondary | ICD-10-CM

## 2021-08-21 DIAGNOSIS — G43109 Migraine with aura, not intractable, without status migrainosus: Secondary | ICD-10-CM

## 2021-08-21 NOTE — Assessment & Plan Note (Signed)
Physiologic no new breaks

## 2021-08-21 NOTE — Progress Notes (Signed)
08/21/2021     CHIEF COMPLAINT Patient presents for  Chief Complaint  Patient presents with   Retina Follow Up      HISTORY OF PRESENT ILLNESS: Dana Barr is a 75 y.o. female who presents to the clinic today for:   HPI     Retina Follow Up   Patient presents with  PVD.  In both eyes.  This started 6 months ago.  Severity is mild.  Duration of 6 months.  Since onset it is stable.        Comments   6 month fu ou and oct/fp Pt states, "Last week I had blurry vision on the right side of my right eye and it would not go away. I spoke with Dr. Zadie Rhine, but then it went away after about 10 mins. Other than that my vision has been pretty stable."       Last edited by Kendra Opitz, COA on 08/21/2021  8:03 AM.      Referring physician: Carol Ada, MD [place],  [place] [id number]  HISTORICAL INFORMATION:   Selected notes from the [facility]: No current outpatient medications on file. (Ophthalmic Drugs)   No current facility-administered medications for this visit. (Ophthalmic Drugs)   Current Outpatient Medications (Other)  Medication Sig   aspirin 81 MG chewable tablet Chew by mouth daily.   Cholecalciferol 25 MCG (1000 UT) tablet Take 1,000 Units by mouth daily.    LACTOBACILLUS PO Take by mouth.   metroNIDAZOLE (METROGEL) 0.75 % gel Apply 1 application topically 2 (two) times daily.   No current facility-administered medications for this visit. (Other)      REVIEW OF SYSTEMS:    ALLERGIES Allergies  Allergen Reactions   Codeine Other (See Comments)    Hallucinations    Predicort [Prednisolone] Other (See Comments)    Caused GERD   Prednisone Other (See Comments)    Caused GERD    PAST MEDICAL HISTORY Past Medical History:  Diagnosis Date   Cancer ([facility]) 2000   Right breast cancer    Personal history of radiation therapy 2000   Past Surgical History:  Procedure  Laterality Date   BREAST LUMPECTOMY Right 2000    FAMILY HISTORY History reviewed. No pertinent family history.  SOCIAL HISTORY Social History   Tobacco Use   Smoking status: Never   Smokeless tobacco: Never  Substance Use Topics   Alcohol use: Never   Drug use: Never         OPHTHALMIC EXAM:  Base Eye Exam     Visual Acuity (ETDRS)       Right Left   Dist [place] 20/20 20/30 -1   Dist ph [place]  20/25         Tonometry (Tonopen, 8:07 AM)       Right Left   Pressure 16 17         Pupils       Pupils Dark Light Shape React APD   Right PERRL 3 3 Round Minimal None   Left PERRL 3 3 Round Minimal None         Visual Fields (Counting fingers)       Left Right    Full Full         Extraocular Movement       Right Left    Full Full         Neuro/Psych  Oriented x3: Yes   Mood/Affect: Normal         Dilation     Both eyes: 1.0% Mydriacyl, 2.5% Phenylephrine @ 8:07 AM           Slit Lamp and Fundus Exam     External Exam       Right Left   External Normal Normal         Slit Lamp Exam       Right Left   Lids/Lashes Normal Normal   Conjunctiva/Sclera White and quiet White and quiet   Cornea Clear Clear   Anterior Chamber PI at 12 PI at 1   Iris Round and reactive Round and reactive   Lens 2+ Nuclear sclerosis 2+ Nuclear sclerosis   Anterior Vitreous Normal Normal         Fundus Exam       Right Left   Posterior Vitreous Posterior vitreous detachment Normal   Disc small cilioretinal artery, Small cilioretinal artery to the nasal aspect of the p.m. bundle Normal   C/D Ratio 0.5 0.3   Macula Normal Normal   Vessels Normal Normal   Periphery good pexy sn and st quads, no breaks no new retinal breaks, good view to the ora serrata, chorio retinal scar at 6:00 and 9:00 Good cryopexy inferotemporal quadrant left eye,            IMAGING AND PROCEDURES  Imaging and Procedures for 08/21/21  OCT, Retina - OU - Both  Eyes       Right Eye Quality was good. Scan locations included subfoveal. Central Foveal Thickness: 307. Progression has been stable. Findings include normal foveal contour.   Left Eye Quality was good. Scan locations included subfoveal. Central Foveal Thickness: 304. Progression has been stable. Findings include normal foveal contour.   Notes Posterior vitreous detachment OU.,, NO ACTIVE MACULOPATHY     Color Fundus Photography Optos - OU - Both Eyes       Right Eye Disc findings include normal observations. Macula : normal observations. Vessels : normal observations.   Left Eye Disc findings include normal observations. Macula : normal observations. Vessels : normal observations.   Notes Peripheral chorioretinal scars inferior and inferotemporal OD  Inferotemporal OS  Retina attached OU             ASSESSMENT/PLAN:  Nuclear sclerotic cataract of both eyes Cataract(s) account for the patient's complaint. I discussed the risks and benefits of cataract surgery. Options were explained to the patient. The patient understands that new glasses may not improve their vision and desires to have cataract surgery. I have recommended follow up with their general eye care doctor for evaluation and consideration of cataract extraction with new intraocular lens insertion. Patient to follow-up with Dr. Guadalupe Dawn.  Patient clearly has darkening of the lenses in each eye with some cloudiness now symptomatic to the patient that would meet the requirements for surgical undertaking of cataract extraction with intraocular lens placement.    Posterior vitreous detachment of both eyes Physiologic no new breaks  History of retinal detachment Condition stable  Ophthalmic migraine Recent migrainous event.  Curiously patient unable to tolerate magnesium supplements thus uses natural foods to keep replenished with magnesium levels     ICD-10-CM   1. Posterior vitreous detachment of both  eyes  H43.813 OCT, Retina - OU - Both Eyes    Color Fundus Photography Optos - OU - Both Eyes    2. Nuclear sclerotic cataract of both eyes  H25.13     3. History of retinal detachment  Z86.69     4. Ophthalmic migraine  G43.109       1.  OU, no new findings.  Patient reassured that likely event of a recent ophthalmic migraine as it was brief and in nature and respecting the vertical midline  2.  Patient having symptoms now that are attributable to the cataract in each eye and density of color and cloudiness that is progressing  3.  Ophthalmic Meds Ordered this visit:  No orders of the defined types were placed in this encounter.      Return in about 1 year (around 08/21/2022) for DILATE OU, OCT, COLOR FP.  There are no Patient Instructions on file for this visit.   Explained the diagnoses, plan, and follow up with the patient and they expressed understanding.  Patient expressed understanding of the importance of proper follow up care.   Clent Demark Abdulkareem Badolato M.D. Diseases & Surgery of the Retina and Vitreous Retina & Diabetic [place] 08/21/21     Abbreviations: M myopia (nearsighted); A astigmatism; H hyperopia (farsighted); P presbyopia; Mrx spectacle prescription;  CTL contact lenses; OD right eye; OS left eye; OU both eyes  XT exotropia; ET esotropia; PEK punctate epithelial keratitis; PEE punctate epithelial erosions; DES dry eye syndrome; MGD meibomian gland dysfunction; ATs artificial tears; PFAT's preservative free artificial tears; [place] nuclear sclerotic cataract; PSC posterior subcapsular cataract; ERM epi-retinal membrane; PVD posterior vitreous detachment; RD retinal detachment; DM diabetes mellitus; DR diabetic retinopathy; NPDR non-proliferative diabetic retinopathy; PDR proliferative diabetic retinopathy; CSME clinically significant macular edema; DME diabetic macular edema; dbh dot blot hemorrhages; CWS cotton wool spot; POAG primary open angle glaucoma; C/D  cup-to-disc ratio; HVF humphrey visual field; GVF goldmann visual field; OCT optical coherence tomography; IOP intraocular pressure; BRVO Branch retinal vein occlusion; CRVO central retinal vein occlusion; CRAO central retinal artery occlusion; BRAO branch retinal artery occlusion; RT retinal tear; SB scleral buckle; PPV pars plana vitrectomy; VH Vitreous hemorrhage; PRP panretinal laser photocoagulation; IVK intravitreal kenalog; VMT vitreomacular traction; MH Macular hole;  NVD neovascularization of the disc; NVE neovascularization elsewhere; AREDS age related eye disease study; ARMD age related macular degeneration; POAG primary open angle glaucoma; EBMD epithelial/anterior basement membrane dystrophy; ACIOL anterior chamber intraocular lens; IOL intraocular lens; PCIOL posterior chamber intraocular lens; Phaco/IOL phacoemulsification with intraocular lens placement; [place] photorefractive keratectomy; LASIK laser assisted in situ keratomileusis; HTN hypertension; DM diabetes mellitus; COPD chronic obstructive pulmonary disease

## 2021-08-21 NOTE — Assessment & Plan Note (Signed)
Condition stable  

## 2021-08-21 NOTE — Assessment & Plan Note (Signed)
Cataract(s) account for the patient's complaint. I discussed the risks and benefits of cataract surgery. Options were explained to the patient. The patient understands that new glasses may not improve their vision and desires to have cataract surgery. I have recommended follow up with their general eye care doctor for evaluation and consideration of cataract extraction with new intraocular lens insertion. Patient to follow-up with Dr. Guadalupe Dawn.  Patient clearly has darkening of the lenses in each eye with some cloudiness now symptomatic to the patient that would meet the requirements for surgical undertaking of cataract extraction with intraocular lens placement.

## 2021-08-21 NOTE — Assessment & Plan Note (Signed)
Recent migrainous event.  Curiously patient unable to tolerate magnesium supplements thus uses natural foods to keep replenished with magnesium levels

## 2021-10-17 DIAGNOSIS — K52832 Lymphocytic colitis: Secondary | ICD-10-CM | POA: Diagnosis not present

## 2021-12-11 DIAGNOSIS — H6123 Impacted cerumen, bilateral: Secondary | ICD-10-CM | POA: Diagnosis not present

## 2022-02-12 DIAGNOSIS — D225 Melanocytic nevi of trunk: Secondary | ICD-10-CM | POA: Diagnosis not present

## 2022-02-12 DIAGNOSIS — L814 Other melanin hyperpigmentation: Secondary | ICD-10-CM | POA: Diagnosis not present

## 2022-02-12 DIAGNOSIS — L718 Other rosacea: Secondary | ICD-10-CM | POA: Diagnosis not present

## 2022-02-12 DIAGNOSIS — L821 Other seborrheic keratosis: Secondary | ICD-10-CM | POA: Diagnosis not present

## 2022-02-12 DIAGNOSIS — L7211 Pilar cyst: Secondary | ICD-10-CM | POA: Diagnosis not present

## 2022-02-12 DIAGNOSIS — L570 Actinic keratosis: Secondary | ICD-10-CM | POA: Diagnosis not present

## 2022-03-13 DIAGNOSIS — Z1159 Encounter for screening for other viral diseases: Secondary | ICD-10-CM | POA: Diagnosis not present

## 2022-03-13 DIAGNOSIS — Z0000 Encounter for general adult medical examination without abnormal findings: Secondary | ICD-10-CM | POA: Diagnosis not present

## 2022-03-13 DIAGNOSIS — E782 Mixed hyperlipidemia: Secondary | ICD-10-CM | POA: Diagnosis not present

## 2022-03-13 DIAGNOSIS — Z1389 Encounter for screening for other disorder: Secondary | ICD-10-CM | POA: Diagnosis not present

## 2022-04-04 ENCOUNTER — Other Ambulatory Visit: Payer: Self-pay | Admitting: Family Medicine

## 2022-04-04 DIAGNOSIS — Z1231 Encounter for screening mammogram for malignant neoplasm of breast: Secondary | ICD-10-CM

## 2022-06-17 DIAGNOSIS — I499 Cardiac arrhythmia, unspecified: Secondary | ICD-10-CM | POA: Diagnosis not present

## 2022-07-04 ENCOUNTER — Ambulatory Visit
Admission: RE | Admit: 2022-07-04 | Discharge: 2022-07-04 | Disposition: A | Discharge status: Home / Self Care | Payer: Medicare Other | Source: Ambulatory Visit | Attending: Family Medicine | Admitting: Family Medicine

## 2022-07-04 DIAGNOSIS — Z1231 Encounter for screening mammogram for malignant neoplasm of breast: Secondary | ICD-10-CM

## 2022-08-01 DIAGNOSIS — H0100B Unspecified blepharitis left eye, upper and lower eyelids: Secondary | ICD-10-CM | POA: Diagnosis not present

## 2022-08-01 DIAGNOSIS — H40033 Anatomical narrow angle, bilateral: Secondary | ICD-10-CM | POA: Diagnosis not present

## 2022-08-01 DIAGNOSIS — H5212 Myopia, left eye: Secondary | ICD-10-CM | POA: Diagnosis not present

## 2022-08-01 DIAGNOSIS — H43813 Vitreous degeneration, bilateral: Secondary | ICD-10-CM | POA: Diagnosis not present

## 2022-08-01 DIAGNOSIS — H52223 Regular astigmatism, bilateral: Secondary | ICD-10-CM | POA: Diagnosis not present

## 2022-08-01 DIAGNOSIS — H0100A Unspecified blepharitis right eye, upper and lower eyelids: Secondary | ICD-10-CM | POA: Diagnosis not present

## 2022-08-01 DIAGNOSIS — H33313 Horseshoe tear of retina without detachment, bilateral: Secondary | ICD-10-CM | POA: Diagnosis not present

## 2022-08-01 DIAGNOSIS — Z83511 Family history of glaucoma: Secondary | ICD-10-CM | POA: Diagnosis not present

## 2022-08-01 DIAGNOSIS — H16223 Keratoconjunctivitis sicca, not specified as Sjogren's, bilateral: Secondary | ICD-10-CM | POA: Diagnosis not present

## 2022-08-01 DIAGNOSIS — H524 Presbyopia: Secondary | ICD-10-CM | POA: Diagnosis not present

## 2022-08-01 DIAGNOSIS — H2513 Age-related nuclear cataract, bilateral: Secondary | ICD-10-CM | POA: Diagnosis not present

## 2022-08-27 ENCOUNTER — Ambulatory Visit (INDEPENDENT_AMBULATORY_CARE_PROVIDER_SITE_OTHER): Payer: Medicare Other | Admitting: Ophthalmology

## 2022-08-27 ENCOUNTER — Encounter (INDEPENDENT_AMBULATORY_CARE_PROVIDER_SITE_OTHER): Payer: Self-pay | Admitting: Ophthalmology

## 2022-08-27 DIAGNOSIS — H2513 Age-related nuclear cataract, bilateral: Secondary | ICD-10-CM

## 2022-08-27 DIAGNOSIS — Z8669 Personal history of other diseases of the nervous system and sense organs: Secondary | ICD-10-CM

## 2022-08-27 DIAGNOSIS — H43813 Vitreous degeneration, bilateral: Secondary | ICD-10-CM | POA: Diagnosis not present

## 2022-08-27 NOTE — Assessment & Plan Note (Signed)
Good retinopexy , no retinal breaks new

## 2022-08-27 NOTE — Progress Notes (Signed)
08/27/2022     CHIEF COMPLAINT Patient presents for  Chief Complaint  Patient presents with   Retina Evaluation      HISTORY OF PRESENT ILLNESS: Dana Barr is a 76 y.o. female who presents to the clinic today for:   HPI   Posterior vitreous detachment of both eyes history of retinal detachments, no new symptoms 1 year fu ou oct fp  Pt states her vision has been stable Pt denies any new floaters or FOL Last edited by Hurman Horn, MD on 08/27/2022  9:05 AM.      Referring physician: Eben Burow, MD [place],  [place] [address]  HISTORICAL INFORMATION:   Selected notes from the [facility]: No current outpatient medications on file. (Ophthalmic Drugs)   No current facility-administered medications for this visit. (Ophthalmic Drugs)   Current Outpatient Medications (Other)  Medication Sig   aspirin 81 MG chewable tablet Chew by mouth daily.   Cholecalciferol 25 MCG (1000 UT) tablet Take 1,000 Units by mouth daily.    LACTOBACILLUS PO Take by mouth.   metroNIDAZOLE (METROGEL) 0.75 % gel Apply 1 application topically 2 (two) times daily.   No current facility-administered medications for this visit. (Other)      REVIEW OF SYSTEMS: ROS   Negative for: Constitutional, Gastrointestinal, Neurological, Skin, Genitourinary, Musculoskeletal, HENT, Endocrine, Cardiovascular, Eyes, Respiratory, Psychiatric, Allergic/Imm, Heme/Lymph Last edited by Morene Rankins, CMA on 08/27/2022  8:07 AM.       ALLERGIES Allergies  Allergen Reactions   Codeine Other (See Comments)    Hallucinations    Predicort [Prednisolone] Other (See Comments)    Caused GERD   Prednisone Other (See Comments)    Caused GERD    PAST MEDICAL HISTORY Past Medical History:  Diagnosis Date   Cancer ([place]) 2000   Right breast cancer    Personal history of radiation therapy 2000   Past Surgical History:  Procedure  Laterality Date   BREAST LUMPECTOMY Right 2000    FAMILY HISTORY History reviewed. No pertinent family history.  SOCIAL HISTORY Social History   Tobacco Use   Smoking status: Never   Smokeless tobacco: Never  Substance Use Topics   Alcohol use: Never   Drug use: Never         OPHTHALMIC EXAM:  Base Eye Exam     Visual Acuity (ETDRS)       Right Left   Dist [place] 20/20 -1 20/20 -1    Correction: Glasses         Tonometry (Tonopen, 8:11 AM)       Right Left   Pressure 10 6         Pupils       Pupils   Right PERRL   Left PERRL         Visual Fields       Left Right    Full Full         Extraocular Movement       Right Left    Ortho Ortho    -- -- --  --  --  -- -- --   -- -- --  --  --  -- -- --           Neuro/Psych     Oriented x3: Yes   Mood/Affect: Normal         Dilation     Both eyes:  1.0% Mydriacyl, 2.5% Phenylephrine @ 8:09 AM           Slit Lamp and Fundus Exam     External Exam       Right Left   External Normal Normal         Slit Lamp Exam       Right Left   Lids/Lashes Normal Normal   Conjunctiva/Sclera White and quiet White and quiet   Cornea Clear Clear   Anterior Chamber PI at 12 PI at 1   Iris Round and reactive Round and reactive   Lens 2+ Nuclear sclerosis 2+ Nuclear sclerosis   Anterior Vitreous Normal Normal         Fundus Exam       Right Left   Posterior Vitreous Posterior vitreous detachment Normal   Disc small cilioretinal artery, Small cilioretinal artery to the nasal aspect of the p.m. bundle Normal   C/D Ratio 0.5 0.3   Macula Normal Normal   Vessels Normal Normal   Periphery good pexy sn and st quads, no breaks no new retinal breaks, good view to the ora serrata, chorio retinal scar at 6:00 and 9:00 Good cryopexy inferotemporal quadrant left eye,            IMAGING AND PROCEDURES  Imaging and Procedures for 08/27/22  OCT, Retina - OU - Both Eyes       Right  Eye Quality was good. Scan locations included subfoveal. Central Foveal Thickness: 305. Progression has been stable. Findings include normal foveal contour.   Left Eye Quality was good. Scan locations included subfoveal. Central Foveal Thickness: 305. Progression has been stable. Findings include normal foveal contour.   Notes Posterior vitreous detachment OU.,, NO ACTIVE MACULOPATHY     Color Fundus Photography Optos - OU - Both Eyes       Right Eye Disc findings include normal observations. Macula : normal observations. Vessels : normal observations.   Left Eye Disc findings include normal observations. Macula : normal observations. Vessels : normal observations.   Notes Peripheral chorioretinal scars inferior and inferotemporal OD  Inferotemporal OS  Retina attached OU             ASSESSMENT/PLAN:  Nuclear sclerotic cataract of both eyes The nature of cataract was discussed with the patient as well as the elective nature of surgery. The patient was reassured that surgery at a later date does not put the patient at risk for a worse outcome. It was emphasized that the need for surgery is dictated by the patient's quality of life as influenced by the cataract. Patient was instructed to maintain close follow up with their general eye care doctor.  History of retinal detachment Good retinopexy , no retinal breaks new     ICD-10-CM   1. Posterior vitreous detachment of both eyes  H43.813 OCT, Retina - OU - Both Eyes    Color Fundus Photography Optos - OU - Both Eyes    2. Nuclear sclerotic cataract of both eyes  H25.13     3. History of retinal detachment  Z86.69       1.  History of retinal detachment OU.  No retinal holes tears that are new.  Not likely to in this patient that is currently phakic  2.  Patient understands she should have prompt reevaluation immediately prior and and soon after cataract surgery whenever it is undertaken to alleviate symptoms  referable to cataract symptoms  3.  Follow-up Dr. Guadalupe Dawn as scheduled. Ophthalmic  Meds Ordered this visit:  No orders of the defined types were placed in this encounter.      Return in about 2 years (around 08/27/2024) for DILATE OU, COLOR FP, OCT.  There are no Patient Instructions on file for this visit.   Explained the diagnoses, plan, and follow up with the patient and they expressed understanding.  Patient expressed understanding of the importance of proper follow up care.   Clent Demark Angelo Caroll M.D. Diseases & Surgery of the Retina and Vitreous [facility] 08/27/22     Abbreviations: M myopia (nearsighted); A astigmatism; H hyperopia (farsighted); P presbyopia; Mrx spectacle prescription;  CTL contact lenses; OD right eye; OS left eye; OU both eyes  XT exotropia; ET esotropia; PEK punctate epithelial keratitis; PEE punctate epithelial erosions; DES dry eye syndrome; MGD meibomian gland dysfunction; ATs artificial tears; PFAT's preservative free artificial tears; [place] nuclear sclerotic cataract; PSC posterior subcapsular cataract; ERM epi-retinal membrane; PVD posterior vitreous detachment; RD retinal detachment; DM diabetes mellitus; DR diabetic retinopathy; NPDR non-proliferative diabetic retinopathy; PDR proliferative diabetic retinopathy; CSME clinically significant macular edema; DME diabetic macular edema; dbh dot blot hemorrhages; CWS cotton wool spot; POAG primary open angle glaucoma; C/D cup-to-disc ratio; HVF humphrey visual field; GVF goldmann visual field; OCT optical coherence tomography; IOP intraocular pressure; BRVO Branch retinal vein occlusion; CRVO central retinal vein occlusion; CRAO central retinal artery occlusion; BRAO branch retinal artery occlusion; RT retinal tear; SB scleral buckle; PPV pars plana vitrectomy; VH Vitreous hemorrhage; PRP panretinal laser photocoagulation; IVK intravitreal kenalog; VMT vitreomacular traction; MH Macular hole;  NVD  neovascularization of the disc; NVE neovascularization elsewhere; AREDS age related eye disease study; ARMD age related macular degeneration; POAG primary open angle glaucoma; EBMD epithelial/anterior basement membrane dystrophy; ACIOL anterior chamber intraocular lens; IOL intraocular lens; PCIOL posterior chamber intraocular lens; Phaco/IOL phacoemulsification with intraocular lens placement; [place] photorefractive keratectomy; LASIK laser assisted in situ keratomileusis; HTN hypertension; DM diabetes mellitus; COPD chronic obstructive pulmonary disease

## 2022-08-27 NOTE — Assessment & Plan Note (Signed)

## 2022-09-22 ENCOUNTER — Emergency Department (HOSPITAL_BASED_OUTPATIENT_CLINIC_OR_DEPARTMENT_OTHER)
Admission: EM | Admit: 2022-09-22 | Discharge: 2022-09-22 | Disposition: A | Discharge status: Home / Self Care | Payer: Medicare Other | Attending: Emergency Medicine | Admitting: Emergency Medicine

## 2022-09-22 ENCOUNTER — Emergency Department (HOSPITAL_BASED_OUTPATIENT_CLINIC_OR_DEPARTMENT_OTHER): Payer: Medicare Other

## 2022-09-22 ENCOUNTER — Encounter (HOSPITAL_BASED_OUTPATIENT_CLINIC_OR_DEPARTMENT_OTHER): Payer: Self-pay

## 2022-09-22 ENCOUNTER — Other Ambulatory Visit: Payer: Self-pay

## 2022-09-22 DIAGNOSIS — S8012XA Contusion of left lower leg, initial encounter: Secondary | ICD-10-CM

## 2022-09-22 DIAGNOSIS — S8011XA Contusion of right lower leg, initial encounter: Secondary | ICD-10-CM | POA: Diagnosis not present

## 2022-09-22 DIAGNOSIS — L819 Disorder of pigmentation, unspecified: Secondary | ICD-10-CM | POA: Insufficient documentation

## 2022-09-22 DIAGNOSIS — Z7982 Long term (current) use of aspirin: Secondary | ICD-10-CM | POA: Diagnosis not present

## 2022-09-22 NOTE — ED Notes (Signed)
Dc instructions reviewed with patient. Patient voiced understanding. Dc with belongings.  °

## 2022-09-22 NOTE — Discharge Instructions (Signed)
Your ultrasound was negative for blood clots in the deep veins of your legs and you have no cordlike mass to suggest a superficial blood clot.  You do have evidence of a contusion/hematoma to the left lower extremity likely sustained from playing with her grandchildren.  Recommend elevation of the extremities for any swelling, rest, NSAIDs as needed

## 2022-09-22 NOTE — ED Triage Notes (Signed)
Pt states that she was playing with her grandchild earlier today and now notices a knot on her L shin. Pt states that her BLE veins are also more "distended" than normal.

## 2022-09-22 NOTE — ED Provider Notes (Signed)
[facility] EMERGENCY DEPT Provider Note   CSN: [id number] Arrival date & time: 09/22/22  8850     History  Chief Complaint  Patient presents with   Leg Injury    Dana Barr is a 76 y.o. female.  The history is provided by the patient.  She has intermittent restless legs where her legs will jerk.  She had this last night and was massaging her legs.  When she lifted her legs, the coloration was different than normal and veins were much more prominent than normal.  She denies any pain and denies any numbness or tingling.  Denies any dyspnea or chest pain.  She states this is never happened before.   Home Medications Prior to Admission medications   Medication Sig Start Date End Date Taking? Authorizing Provider  aspirin 81 MG chewable tablet Chew by mouth daily.    [provider]  Cholecalciferol 25 MCG (1000 UT) tablet Take 1,000 Units by mouth daily.     [provider]  LACTOBACILLUS PO Take by mouth.    [provider]  metroNIDAZOLE (METROGEL) 0.75 % gel Apply 1 application topically 2 (two) times daily.    [provider]      Allergies    Codeine, Predicort [prednisolone], and Prednisone    Review of Systems   Review of Systems  All other systems reviewed and are negative.   Physical Exam Updated Vital Signs BP (!) 121/58   Pulse (!) 51   Temp 98.3 F (36.8 C) (Oral)   Resp 16   Ht '5\' 4"'$  (1.626 m)   Wt 51.7 kg   SpO2 100%   BMI 19.57 kg/m  Physical Exam Vitals and nursing note reviewed.   76 year old female, resting comfortably and in no acute distress. Vital signs are significant for slightly slow heart rate. Oxygen saturation is 100%, which is normal. Head is normocephalic and atraumatic. PERRLA, EOMI. Oropharynx is clear. Neck is nontender and supple without adenopathy or JVD. Back is nontender and there is no CVA tenderness. Lungs are clear without rales, wheezes, or rhonchi. Chest is  nontender. Heart has regular rate and rhythm without murmur. Abdomen is soft, flat, nontender. Extremities: Discoloration noted over the anterior left lower leg and it appears to be an ecchymosis.  Prominent surface veins over both lower legs, but no varicosities noted.  There is no swelling noted.  There is no calf tenderness.  Bevelyn Buckles' sign is negative. Skin is warm and dry without rash. Neurologic: Mental status is normal, cranial nerves are intact, moves all extremities equally.   ED Results / Procedures / Treatments    Radiology No results found.  Procedures Procedures    Medications Ordered in ED Medications - No data to display  ED Course/ Medical Decision Making/ A&P                           Medical Decision Making  Report of leg discoloration which is not present currently.  She does appear to have an ecchymosis of her right leg, but this does not appear to be acute based on coloration.  No physical findings to suggest ischemia.  I have ordered the venous ultrasound.  Case is signed out to Dr. Armandina Gemma, oncoming physician.  Final Clinical Impression(s) / ED Diagnoses Final diagnoses:  Discoloration of skin of lower leg    Rx / DC Orders ED Discharge Orders     None  Delora Fuel, MD 93/57/01 [phone]

## 2022-09-22 NOTE — ED Notes (Signed)
ED Provider at bedside.  Pt GCS 15.  Reports BLE distended veins have improved since legs have been elevated -- denies having new pain since noting LE venous changes - denies paresthesias.  BLE distal neurovascular status remains intact with +pedal pulses.  Pt agreeable with plan for BLE doppler - denies any addl need questions concerns at this time.  VSS.

## 2022-09-22 NOTE — ED Provider Notes (Signed)
  Physical Exam  BP (!) 121/58   Pulse (!) 51   Temp 98.3 F (36.8 C) (Oral)   Resp 16   Ht '5\' 4"'$  (1.626 m)   Wt 51.7 kg   SpO2 100%   BMI 19.57 kg/m     Procedures  Procedures  ED Course / MDM    Medical Decision Making  54F, presenting with legs jerking and prominent veins, some discoloration in her legs, no significant swelling. No clear evidence of varicose veins. DVT US pending.    On my evaluation, the patient was stable.  Vitally stable.  Well-appearing with no complaints.  No swelling in the bilateral lower extremities.  A contusion/hematoma was noted to the left lower anterior extremity likely sustained while roughhousing with one of her grandchildren as the patient noted yesterday.  No pain to suggest bony fracture.  DVT ultrasound was performed which resulted negative for acute DVT.  The patient was ambulatory in the emergency department and overall well-appearing, stable for discharge.  Advised rest, elevation of the extremities as needed, NSAIDs for pain control.   Regan Lemming, MD 09/22/22 [phone]

## 2022-10-02 ENCOUNTER — Telehealth: Payer: Self-pay

## 2022-10-02 NOTE — Telephone Encounter (Signed)
       Patient  visited Jaidynn Balster on 09/22/22  f  Telephone encounter attempt :  1st  A HIPAA compliant voice message was left requesting a return call.  Instructed patient to call back   [place], [facility]  [phone] [address], [place], [place] [address] Phone: [phone] Email: [email or web address]

## 2022-11-25 DIAGNOSIS — J0100 Acute maxillary sinusitis, unspecified: Secondary | ICD-10-CM | POA: Diagnosis not present

## 2022-12-19 DIAGNOSIS — H6123 Impacted cerumen, bilateral: Secondary | ICD-10-CM | POA: Diagnosis not present

## 2023-04-14 DIAGNOSIS — L821 Other seborrheic keratosis: Secondary | ICD-10-CM | POA: Diagnosis not present

## 2023-04-14 DIAGNOSIS — L718 Other rosacea: Secondary | ICD-10-CM | POA: Diagnosis not present

## 2023-04-14 DIAGNOSIS — D1723 Benign lipomatous neoplasm of skin and subcutaneous tissue of right leg: Secondary | ICD-10-CM | POA: Diagnosis not present

## 2023-04-14 DIAGNOSIS — L570 Actinic keratosis: Secondary | ICD-10-CM | POA: Diagnosis not present

## 2023-04-14 DIAGNOSIS — D1801 Hemangioma of skin and subcutaneous tissue: Secondary | ICD-10-CM | POA: Diagnosis not present

## 2023-04-14 DIAGNOSIS — Q828 Other specified congenital malformations of skin: Secondary | ICD-10-CM | POA: Diagnosis not present

## 2023-04-14 DIAGNOSIS — L814 Other melanin hyperpigmentation: Secondary | ICD-10-CM | POA: Diagnosis not present

## 2023-04-14 DIAGNOSIS — I788 Other diseases of capillaries: Secondary | ICD-10-CM | POA: Diagnosis not present

## 2023-04-14 DIAGNOSIS — L7211 Pilar cyst: Secondary | ICD-10-CM | POA: Diagnosis not present

## 2023-04-16 DIAGNOSIS — Z0000 Encounter for general adult medical examination without abnormal findings: Secondary | ICD-10-CM | POA: Diagnosis not present

## 2023-04-16 DIAGNOSIS — E782 Mixed hyperlipidemia: Secondary | ICD-10-CM | POA: Diagnosis not present

## 2023-04-16 DIAGNOSIS — M810 Age-related osteoporosis without current pathological fracture: Secondary | ICD-10-CM | POA: Diagnosis not present

## 2023-04-17 ENCOUNTER — Other Ambulatory Visit: Payer: Self-pay | Admitting: Family Medicine

## 2023-04-17 DIAGNOSIS — Z1231 Encounter for screening mammogram for malignant neoplasm of breast: Secondary | ICD-10-CM

## 2023-07-09 ENCOUNTER — Ambulatory Visit
Admission: RE | Admit: 2023-07-09 | Discharge: 2023-07-09 | Disposition: A | Discharge status: Home / Self Care | Payer: Medicare Other | Source: Ambulatory Visit | Attending: Family Medicine | Admitting: Family Medicine

## 2023-07-09 DIAGNOSIS — Z1231 Encounter for screening mammogram for malignant neoplasm of breast: Secondary | ICD-10-CM

## 2023-08-28 ENCOUNTER — Encounter (INDEPENDENT_AMBULATORY_CARE_PROVIDER_SITE_OTHER): Payer: Medicare Other | Admitting: Ophthalmology

## 2023-08-28 DIAGNOSIS — H2513 Age-related nuclear cataract, bilateral: Secondary | ICD-10-CM | POA: Diagnosis not present

## 2023-08-28 DIAGNOSIS — H43813 Vitreous degeneration, bilateral: Secondary | ICD-10-CM | POA: Diagnosis not present

## 2023-08-28 DIAGNOSIS — G43109 Migraine with aura, not intractable, without status migrainosus: Secondary | ICD-10-CM | POA: Diagnosis not present

## 2023-08-28 DIAGNOSIS — H31093 Other chorioretinal scars, bilateral: Secondary | ICD-10-CM | POA: Diagnosis not present

## 2023-10-03 DIAGNOSIS — L718 Other rosacea: Secondary | ICD-10-CM | POA: Diagnosis not present

## 2023-10-03 DIAGNOSIS — L570 Actinic keratosis: Secondary | ICD-10-CM | POA: Diagnosis not present

## 2023-10-17 ENCOUNTER — Other Ambulatory Visit: Payer: Self-pay

## 2023-10-17 ENCOUNTER — Ambulatory Visit
Admission: RE | Admit: 2023-10-17 | Discharge: 2023-10-17 | Disposition: A | Discharge status: Home / Self Care | Payer: Medicare Other | Source: Ambulatory Visit | Attending: Family Medicine | Admitting: Family Medicine

## 2023-10-17 ENCOUNTER — Other Ambulatory Visit: Payer: Self-pay | Admitting: Family Medicine

## 2023-10-17 ENCOUNTER — Emergency Department (HOSPITAL_BASED_OUTPATIENT_CLINIC_OR_DEPARTMENT_OTHER)
Admission: EM | Admit: 2023-10-17 | Discharge: 2023-10-18 | Disposition: A | Discharge status: Home / Self Care | Payer: Medicare Other | Attending: Emergency Medicine | Admitting: Emergency Medicine

## 2023-10-17 DIAGNOSIS — J069 Acute upper respiratory infection, unspecified: Secondary | ICD-10-CM | POA: Diagnosis not present

## 2023-10-17 DIAGNOSIS — Z7982 Long term (current) use of aspirin: Secondary | ICD-10-CM | POA: Diagnosis not present

## 2023-10-17 DIAGNOSIS — M62838 Other muscle spasm: Secondary | ICD-10-CM

## 2023-10-17 DIAGNOSIS — M436 Torticollis: Secondary | ICD-10-CM

## 2023-10-17 DIAGNOSIS — R059 Cough, unspecified: Secondary | ICD-10-CM | POA: Diagnosis not present

## 2023-10-17 DIAGNOSIS — M549 Dorsalgia, unspecified: Secondary | ICD-10-CM | POA: Diagnosis present

## 2023-10-17 NOTE — ED Triage Notes (Signed)
Pt reporting "spasm" pain in her upper back and neck area and that pain then radiates down her spinal muscles. Pt started this morning when she woke up this morning that has become increasingly worse. Hx of arthritis in her back. Nausea. No numbness or tingling. Was seen at her doctor this morning for r/o pneumonia, reports only sob with increased pain.

## 2023-10-18 ENCOUNTER — Emergency Department (HOSPITAL_BASED_OUTPATIENT_CLINIC_OR_DEPARTMENT_OTHER): Payer: Medicare Other

## 2023-10-18 DIAGNOSIS — M4802 Spinal stenosis, cervical region: Secondary | ICD-10-CM | POA: Diagnosis not present

## 2023-10-18 DIAGNOSIS — I672 Cerebral atherosclerosis: Secondary | ICD-10-CM | POA: Diagnosis not present

## 2023-10-18 DIAGNOSIS — I651 Occlusion and stenosis of basilar artery: Secondary | ICD-10-CM | POA: Diagnosis not present

## 2023-10-18 DIAGNOSIS — M47812 Spondylosis without myelopathy or radiculopathy, cervical region: Secondary | ICD-10-CM | POA: Diagnosis not present

## 2023-10-18 DIAGNOSIS — M542 Cervicalgia: Secondary | ICD-10-CM | POA: Diagnosis not present

## 2023-10-18 DIAGNOSIS — M5030 Other cervical disc degeneration, unspecified cervical region: Secondary | ICD-10-CM | POA: Diagnosis not present

## 2023-10-18 LAB — CBC WITH DIFFERENTIAL/PLATELET
Abs Immature Granulocytes: 0.04 10*3/uL (ref 0.00–0.07)
Basophils Absolute: 0.1 10*3/uL (ref 0.0–0.1)
Basophils Relative: 0 %
Eosinophils Absolute: 0 10*3/uL (ref 0.0–0.5)
Eosinophils Relative: 0 %
HCT: 35 % — ABNORMAL LOW (ref 36.0–46.0)
Hemoglobin: 11.8 g/dL — ABNORMAL LOW (ref 12.0–15.0)
Immature Granulocytes: 0 %
Lymphocytes Relative: 10 %
Lymphs Abs: 1.1 10*3/uL (ref 0.7–4.0)
MCH: 27.3 pg (ref 26.0–34.0)
MCHC: 33.7 g/dL (ref 30.0–36.0)
MCV: 81 fL (ref 80.0–100.0)
Monocytes Absolute: 0.8 10*3/uL (ref 0.1–1.0)
Monocytes Relative: 6 %
Neutro Abs: 9.7 10*3/uL — ABNORMAL HIGH (ref 1.7–7.7)
Neutrophils Relative %: 84 %
Platelets: 417 10*3/uL — ABNORMAL HIGH (ref 150–400)
RBC: 4.32 MIL/uL (ref 3.87–5.11)
RDW: 13.6 % (ref 11.5–15.5)
WBC: 11.6 10*3/uL — ABNORMAL HIGH (ref 4.0–10.5)
nRBC: 0 % (ref 0.0–0.2)

## 2023-10-18 LAB — URINALYSIS, W/ REFLEX TO CULTURE (INFECTION SUSPECTED)
Bacteria, UA: NONE SEEN
Bilirubin Urine: NEGATIVE
Glucose, UA: NEGATIVE mg/dL
Ketones, ur: NEGATIVE mg/dL
Leukocytes,Ua: NEGATIVE
Nitrite: NEGATIVE
Protein, ur: NEGATIVE mg/dL
Specific Gravity, Urine: 1.012 (ref 1.005–1.030)
pH: 8 (ref 5.0–8.0)

## 2023-10-18 LAB — COMPREHENSIVE METABOLIC PANEL
ALT: 16 U/L (ref 0–44)
AST: 18 U/L (ref 15–41)
Albumin: 4.2 g/dL (ref 3.5–5.0)
Alkaline Phosphatase: 79 U/L (ref 38–126)
Anion gap: 12 (ref 5–15)
BUN: 19 mg/dL (ref 8–23)
CO2: 26 mmol/L (ref 22–32)
Calcium: 10.8 mg/dL — ABNORMAL HIGH (ref 8.9–10.3)
Chloride: 100 mmol/L (ref 98–111)
Creatinine, Ser: 0.78 mg/dL (ref 0.44–1.00)
GFR, Estimated: >60 mL/min (ref >60)
Glucose, Bld: 126 mg/dL — ABNORMAL HIGH (ref 70–99)
Potassium: 3.5 mmol/L (ref 3.5–5.1)
Sodium: 138 mmol/L (ref 135–145)
Total Bilirubin: 0.3 mg/dL (ref <1.2)
Total Protein: 7.2 g/dL (ref 6.5–8.1)

## 2023-10-18 LAB — MAGNESIUM: Magnesium: 2.1 mg/dL (ref 1.7–2.4)

## 2023-10-18 MED ORDER — KETOROLAC TROMETHAMINE 30 MG/ML IJ SOLN
15.0000 mg | Freq: Once | INTRAMUSCULAR | Status: AC
  Administered 2023-10-18: 15 mg via INTRAVENOUS
  Filled 2023-10-18: qty 1

## 2023-10-18 MED ORDER — SODIUM CHLORIDE 0.9 % IV BOLUS
1000.0000 mL | Freq: Once | INTRAVENOUS | Status: AC
  Administered 2023-10-18: 1000 mL via INTRAVENOUS

## 2023-10-18 MED ORDER — IOHEXOL 350 MG/ML SOLN
75.0000 mL | Freq: Once | INTRAVENOUS | Status: AC | PRN
  Administered 2023-10-18: 75 mL via INTRAVENOUS

## 2023-10-18 MED ORDER — CYCLOBENZAPRINE HCL 10 MG PO TABS: 10.0000 mg | ORAL_TABLET | Freq: Two times a day (BID) | ORAL | 0 refills | Status: AC | PRN

## 2023-10-18 MED ORDER — MELOXICAM 15 MG PO TABS: 15.0000 mg | ORAL_TABLET | Freq: Every day | ORAL | 0 refills | Status: AC

## 2023-10-18 MED ORDER — FENTANYL CITRATE PF 50 MCG/ML IJ SOSY
50.0000 ug | PREFILLED_SYRINGE | Freq: Once | INTRAMUSCULAR | Status: AC
  Administered 2023-10-18: 50 ug via INTRAVENOUS
  Filled 2023-10-18: qty 1

## 2023-10-18 MED ORDER — LIDOCAINE 5 % EX PTCH
1.0000 | MEDICATED_PATCH | CUTANEOUS | Status: DC
  Administered 2023-10-18: 1 via TRANSDERMAL
  Filled 2023-10-18: qty 1

## 2023-10-18 MED ORDER — DIPHENHYDRAMINE HCL 50 MG/ML IJ SOLN
25.0000 mg | Freq: Once | INTRAMUSCULAR | Status: AC
  Administered 2023-10-18: 25 mg via INTRAVENOUS
  Filled 2023-10-18: qty 1

## 2023-10-18 MED ORDER — HYDROMORPHONE HCL 1 MG/ML IJ SOLN
0.5000 mg | Freq: Once | INTRAMUSCULAR | Status: AC
  Administered 2023-10-18: 0.5 mg via INTRAVENOUS
  Filled 2023-10-18: qty 1

## 2023-10-18 MED ORDER — CYCLOBENZAPRINE HCL 5 MG PO TABS
5.0000 mg | ORAL_TABLET | Freq: Once | ORAL | Status: AC
  Administered 2023-10-18: 5 mg via ORAL
  Filled 2023-10-18: qty 1

## 2023-10-18 NOTE — ED Notes (Signed)

## 2023-10-18 NOTE — ED Notes (Signed)
Pt ambulated to restroom, steady gait  

## 2023-10-19 NOTE — ED Provider Notes (Signed)
[place] [facility]GENC[facility] DEPARTMENT AT [facility] Provider Note   CSN: [id number] Arrival date & time: 10/17/23  2245     History  Chief Complaint  Patient presents with   Back Pain    Dana Barr is a 77 y.o. female.  Patient presents with tightness and pain in her left side of her neck after waking up yesterday morning.  It progressively worsened throughout the day so she tried taking tizanidine.  It did not seem to help so she took a second 1 is that she started get what she calls restless legs afterwards.  She then presents here for further evaluation.  No neurologic changes.  States she has had symptoms like this before but not quite as bad.  Any movement of her neck makes it worse.  She is also pretty worried about the restless legs. No trauma. Did recently  have a URI which caused significant amounts of coughing but coughing has been improving and had cxr earlier in the day that was negative.    Back Pain      Home Medications Prior to Admission medications   Medication Sig Start Date End Date Taking? Authorizing Provider  cyclobenzaprine (FLEXERIL) 10 MG tablet Take 1 tablet (10 mg total) by mouth 2 (two) times daily as needed for muscle spasms. 10/18/23  Yes Alexx Mcburney, Barbara Cower, MD  meloxicam (MOBIC) 15 MG tablet Take 1 tablet (15 mg total) by mouth daily. 10/18/23  Yes Rondarius Kadrmas, Barbara Cower, MD  aspirin 81 MG chewable tablet Chew by mouth daily.    [provider]  Cholecalciferol 25 MCG (1000 UT) tablet Take 1,000 Units by mouth daily.     [provider]  LACTOBACILLUS PO Take by mouth.    [provider]  metroNIDAZOLE (METROGEL) 0.75 % gel Apply 1 application topically 2 (two) times daily.    [provider]      Allergies    Codeine, Predicort [prednisolone], and Prednisone    Review of Systems   Review of Systems  Musculoskeletal:  Positive for back pain.    Physical Exam Updated Vital Signs BP (!) 123/59   Pulse 80    Temp 98.4 F (36.9 C) (Oral)   Resp 20   SpO2 96%  Physical Exam Vitals and nursing note reviewed.  Constitutional:      Appearance: She is well-developed.  HENT:     Head: Normocephalic and atraumatic.  Eyes:     Pupils: Pupils are equal, round, and reactive to light.  Cardiovascular:     Rate and Rhythm: Normal rate and regular rhythm.  Pulmonary:     Effort: No respiratory distress.     Breath sounds: No stridor.  Abdominal:     General: There is no distension.  Musculoskeletal:     Cervical back: Normal range of motion.     Comments: Tight L>R trapezius with ttp and worse with contraction  Skin:    General: Skin is warm and dry.  Neurological:     General: No focal deficit present.     Mental Status: She is alert.     Comments: Mild trembling of BLE     ED Results / Procedures / Treatments   Labs (all labs ordered are listed, but only abnormal results are displayed) Labs Reviewed  CBC WITH DIFFERENTIAL/PLATELET - Abnormal; Notable for the following components:      Result Value   WBC 11.6 (*)    Hemoglobin 11.8 (*)    HCT 35.0 (*)  Platelets 417 (*)    Neutro Abs 9.7 (*)    All other components within normal limits  COMPREHENSIVE METABOLIC PANEL - Abnormal; Notable for the following components:   Glucose, Bld 126 (*)    Calcium 10.8 (*)    All other components within normal limits  URINALYSIS, W/ REFLEX TO CULTURE (INFECTION SUSPECTED) - Abnormal; Notable for the following components:   Hgb urine dipstick TRACE (*)    All other components within normal limits  MAGNESIUM    EKG None  Radiology CT Angio Neck W and/or Wo Contrast  Result Date: 10/18/2023 CLINICAL DATA:  77 year old female with neck and upper back pain radiating to her spine since yesterday morning. Progressive symptoms. EXAM: CT ANGIOGRAPHY NECK TECHNIQUE: Multidetector CT imaging of the head and neck was performed using the standard protocol during bolus administration of intravenous  contrast. Multiplanar CT image reconstructions and MIPs were obtained to evaluate the vascular anatomy. Carotid stenosis measurements (when applicable) are obtained utilizing NASCET criteria, using the distal internal carotid diameter as the denominator. RADIATION DOSE REDUCTION: This exam was performed according to the departmental dose-optimization program which includes automated exposure control, adjustment of the mA and/or kV according to patient size and/or use of iterative reconstruction technique. CONTRAST:  75mL OMNIPAQUE IOHEXOL 350 MG/ML SOLN COMPARISON:  CT cervical spine reported separately. Head CT 11/26/2019. FINDINGS: Other findings: No evidence of intracranial mass effect or ventriculomegaly. Visualized orbits and scalp soft tissues are within normal limits. Right maxillary sinus layering fluid appears inflammatory. Other Visualized paranasal sinuses and mastoids are stable and well aerated. CTA NECK Skeleton: Cervical spine detailed separately. Left TMJ degeneration. No acute or suspicious osseous lesion identified. Upper chest: Calcified left upper lobe granuloma and mild upper lung atelectasis. Small calcified postinflammatory mediastinal and left hilar lymph nodes. Other neck: Negative thyroid, larynx, pharynx, parapharyngeal spaces, retropharyngeal space, sublingual space, submandibular spaces, masticator and parotid spaces. No cervical lymphadenopathy identified. Aortic arch: Mildly tortuous aortic arch. Three vessel arch with no atherosclerosis. Right carotid system: Tortuous brachiocephalic and proximal right CCA with no plaque or stenosis. Negative right carotid bifurcation, partially retropharyngeal but otherwise negative cervical right ICA. Left carotid system: Similar tortuosity with no plaque or stenosis. Tortuous left ICA just below the skull base. Vertebral arteries: Calcified right subclavian artery origin without stenosis. Normal right vertebral artery origin. Right vertebral artery  is patent and normal to the C1 level. There is mild right V3 calcified plaque without stenosis on series 6, image 76. Tortuous proximal left subclavian artery without plaque or stenosis. Normal left vertebral artery origin. Fairly codominant and mildly tortuous left vertebral artery is patent and normal to the skull base. CTA HEAD Posterior circulation: Codominant vertebral V4 segments are patent to the basilar with no significant plaque or stenosis. Normal PICA origins. Patent basilar artery which narrows and becomes irregular distally. Mild distal basilar stenosis on series 11, image 123. Basilar tip, SCA and PCA origins remain patent and there are fetal type bilateral PCA origins. Visible PCA branches are within normal limits. Anterior circulation: Both ICA siphons are patent with normal posterior communicating artery origins. Mild for age bilateral siphon calcified plaque without stenosis. Patent carotid termini. Patent MCA and ACA origins. Visible ACAs and anterior communicating artery appear normal. Visible MCA branches appear patent without stenosis. Anatomic variants: Fetal type PCA origins. Review of the MIP images confirms the above findings IMPRESSION: 1. Negative for large vessel occlusion. 2. No significant atherosclerosis in the neck. Intracranial atherosclerosis with mild stenosis of  the distal Basilar artery. No other hemodynamically significant arterial stenosis. 3. Cervical spine CT reported separately. 4. Chronic post granulomatous findings in the left upper chest. Electronically Signed   By: Odessa Fleming M.D.   On: 10/18/2023 07:39   CT C-SPINE NO CHARGE  Result Date: 10/18/2023 CLINICAL DATA:  77 year old female with neck and upper back pain radiating to her spine since yesterday morning. Progressive symptoms. EXAM: CT CERVICAL SPINE WITH CONTRAST TECHNIQUE: Multiplanar CT images of the cervical spine were reconstructed from contemporary CTA of the Neck. RADIATION DOSE REDUCTION: This exam was  performed according to the departmental dose-optimization program which includes automated exposure control, adjustment of the mA and/or kV according to patient size and/or use of iterative reconstruction technique. CONTRAST:  No additional COMPARISON:  CTA reported separately. FINDINGS: Alignment: Normal cervical lordosis. Cervicothoracic junction alignment is within normal limits. Bilateral posterior element alignment is within normal limits. Skull base and vertebrae: Bone mineralization is within normal limits for age. Visualized skull base is intact. No atlanto-occipital dissociation. C1 and C2 appear intact and aligned. No acute osseous abnormality identified. Soft tissues and spinal canal: No prevertebral fluid or swelling. No visible canal hematoma. CTA neck reported separately. Disc levels: Multilevel left side cervical facet arthropathy is moderate. Comparatively mild cervical disc degeneration. No strong CT evidence of cervical spinal stenosis. Upper chest: Reported separately. Other: Cervicomedullary junction is within normal limits. Grossly negative visible brain parenchyma. IMPRESSION: 1. Moderate multilevel left side cervical facet arthropathy. But otherwise negative for age CT appearance of the cervical spine. 2. CTA Neck reported separately. Electronically Signed   By: Odessa Fleming M.D.   On: 10/18/2023 07:32   DG Chest 2 View  Result Date: 10/17/2023 CLINICAL DATA:  Cough. EXAM: CHEST - 2 VIEW COMPARISON:  June 15, 2007. FINDINGS: The heart size and mediastinal contours are within normal limits. Stable calcified granuloma seen in left lung. No acute pulmonary disease is noted. The visualized skeletal structures are unremarkable. IMPRESSION: No active cardiopulmonary disease. Electronically Signed   By: Lupita Raider M.D.   On: 10/17/2023 13:54    Procedures Procedures    Medications Ordered in ED Medications  diphenhydrAMINE (BENADRYL) injection 25 mg (25 mg Intravenous Given 10/18/23 0109)   fentaNYL (SUBLIMAZE) injection 50 mcg (50 mcg Intravenous Given 10/18/23 0247)  sodium chloride 0.9 % bolus 1,000 mL (0 mLs Intravenous Stopped 10/18/23 0654)  HYDROmorphone (DILAUDID) injection 0.5 mg (0.5 mg Intravenous Given 10/18/23 0631)  cyclobenzaprine (FLEXERIL) tablet 5 mg (5 mg Oral Given 10/18/23 0631)  ketorolac (TORADOL) 30 MG/ML injection 15 mg (15 mg Intravenous Given 10/18/23 0631)  iohexol (OMNIPAQUE) 350 MG/ML injection 75 mL (75 mLs Intravenous Contrast Given 10/18/23 0703)    ED Course/ Medical Decision Making/ A&P                                 Medical Decision Making Amount and/or Complexity of Data Reviewed Labs: ordered. Radiology: ordered. ECG/medicine tests: ordered.  Risk Prescription drug management.   Patient's symptoms seem very consistent with torticollis however did not improve with regular medicines.  She got Benadryl as well to try to help with likely akathisia's that was causing restless legs.  Pain seemed to gotten worse with those medications so that time consider possible vertebral artery dissection or spinal pathology so CT angio done and a second round medications given.  Securon medicines completely resolved all of her symptoms.  She  is feeling baseline.  CT angio was done, interpreted and reviewed by myself without any significant stenosis or dissection.  Did have some mild basilar artery stenosis.  She was made aware that we will follow-up with her doctor for any type of follow-up but no specific indications for follow-up at this time.  Ambulates on her own accord.  Eat and drink normally.  No other associated symptoms.  Low suspicion for meningitis, fracture, abscess or any other emergent causes at this time.  Final Clinical Impression(s) / ED Diagnoses Final diagnoses:  Neck muscle spasm  Torticollis    Rx / DC Orders ED Discharge Orders          Ordered    cyclobenzaprine (FLEXERIL) 10 MG tablet  2 times daily PRN        10/18/23 0742     meloxicam (MOBIC) 15 MG tablet  Daily        10/18/23 0742              Echo [place]sbrook, Barbara Cower, MD 10/19/23 [phone]

## 2023-11-07 DIAGNOSIS — H0100A Unspecified blepharitis right eye, upper and lower eyelids: Secondary | ICD-10-CM | POA: Diagnosis not present

## 2023-11-07 DIAGNOSIS — H33313 Horseshoe tear of retina without detachment, bilateral: Secondary | ICD-10-CM | POA: Diagnosis not present

## 2023-11-07 DIAGNOSIS — H2513 Age-related nuclear cataract, bilateral: Secondary | ICD-10-CM | POA: Diagnosis not present

## 2023-11-07 DIAGNOSIS — H5203 Hypermetropia, bilateral: Secondary | ICD-10-CM | POA: Diagnosis not present

## 2023-11-07 DIAGNOSIS — H52203 Unspecified astigmatism, bilateral: Secondary | ICD-10-CM | POA: Diagnosis not present

## 2023-11-07 DIAGNOSIS — H43813 Vitreous degeneration, bilateral: Secondary | ICD-10-CM | POA: Diagnosis not present

## 2023-11-07 DIAGNOSIS — H0100B Unspecified blepharitis left eye, upper and lower eyelids: Secondary | ICD-10-CM | POA: Diagnosis not present

## 2023-11-07 DIAGNOSIS — H524 Presbyopia: Secondary | ICD-10-CM | POA: Diagnosis not present

## 2023-11-07 DIAGNOSIS — H04123 Dry eye syndrome of bilateral lacrimal glands: Secondary | ICD-10-CM | POA: Diagnosis not present

## 2023-11-07 DIAGNOSIS — H40033 Anatomical narrow angle, bilateral: Secondary | ICD-10-CM | POA: Diagnosis not present

## 2023-11-07 DIAGNOSIS — Z83511 Family history of glaucoma: Secondary | ICD-10-CM | POA: Diagnosis not present

## 2024-01-13 DIAGNOSIS — T691XXA Chilblains, initial encounter: Secondary | ICD-10-CM | POA: Diagnosis not present

## 2024-01-13 DIAGNOSIS — L308 Other specified dermatitis: Secondary | ICD-10-CM | POA: Diagnosis not present

## 2024-02-02 DIAGNOSIS — H900 Conductive hearing loss, bilateral: Secondary | ICD-10-CM | POA: Diagnosis not present

## 2024-02-02 DIAGNOSIS — H6123 Impacted cerumen, bilateral: Secondary | ICD-10-CM | POA: Diagnosis not present

## 2024-04-07 ENCOUNTER — Other Ambulatory Visit: Payer: Self-pay | Admitting: Family Medicine

## 2024-04-07 DIAGNOSIS — Z1231 Encounter for screening mammogram for malignant neoplasm of breast: Secondary | ICD-10-CM

## 2024-05-18 DIAGNOSIS — Z0000 Encounter for general adult medical examination without abnormal findings: Secondary | ICD-10-CM | POA: Diagnosis not present

## 2024-05-18 DIAGNOSIS — T691XXD Chilblains, subsequent encounter: Secondary | ICD-10-CM | POA: Diagnosis not present

## 2024-05-18 DIAGNOSIS — E782 Mixed hyperlipidemia: Secondary | ICD-10-CM | POA: Diagnosis not present

## 2024-05-18 DIAGNOSIS — H4020X0 Unspecified primary angle-closure glaucoma, stage unspecified: Secondary | ICD-10-CM | POA: Diagnosis not present

## 2024-05-18 DIAGNOSIS — I868 Varicose veins of other specified sites: Secondary | ICD-10-CM | POA: Diagnosis not present

## 2024-05-18 DIAGNOSIS — M546 Pain in thoracic spine: Secondary | ICD-10-CM | POA: Diagnosis not present

## 2024-05-18 DIAGNOSIS — M810 Age-related osteoporosis without current pathological fracture: Secondary | ICD-10-CM | POA: Diagnosis not present

## 2024-06-03 DIAGNOSIS — I872 Venous insufficiency (chronic) (peripheral): Secondary | ICD-10-CM | POA: Diagnosis not present

## 2024-06-03 DIAGNOSIS — I87393 Chronic venous hypertension (idiopathic) with other complications of bilateral lower extremity: Secondary | ICD-10-CM | POA: Diagnosis not present

## 2024-06-15 DIAGNOSIS — D485 Neoplasm of uncertain behavior of skin: Secondary | ICD-10-CM | POA: Diagnosis not present

## 2024-06-15 DIAGNOSIS — L578 Other skin changes due to chronic exposure to nonionizing radiation: Secondary | ICD-10-CM | POA: Diagnosis not present

## 2024-06-15 DIAGNOSIS — L500 Allergic urticaria: Secondary | ICD-10-CM | POA: Diagnosis not present

## 2024-06-15 DIAGNOSIS — L570 Actinic keratosis: Secondary | ICD-10-CM | POA: Diagnosis not present

## 2024-06-15 DIAGNOSIS — M7130 Other bursal cyst, unspecified site: Secondary | ICD-10-CM | POA: Diagnosis not present

## 2024-06-15 DIAGNOSIS — L603 Nail dystrophy: Secondary | ICD-10-CM | POA: Diagnosis not present

## 2024-06-15 DIAGNOSIS — L821 Other seborrheic keratosis: Secondary | ICD-10-CM | POA: Diagnosis not present

## 2024-06-15 DIAGNOSIS — L7211 Pilar cyst: Secondary | ICD-10-CM | POA: Diagnosis not present

## 2024-06-18 DIAGNOSIS — S134XXA Sprain of ligaments of cervical spine, initial encounter: Secondary | ICD-10-CM | POA: Diagnosis not present

## 2024-06-23 DIAGNOSIS — S134XXA Sprain of ligaments of cervical spine, initial encounter: Secondary | ICD-10-CM | POA: Diagnosis not present

## 2024-06-30 DIAGNOSIS — S134XXA Sprain of ligaments of cervical spine, initial encounter: Secondary | ICD-10-CM | POA: Diagnosis not present

## 2024-07-07 DIAGNOSIS — S134XXA Sprain of ligaments of cervical spine, initial encounter: Secondary | ICD-10-CM | POA: Diagnosis not present

## 2024-07-09 ENCOUNTER — Ambulatory Visit
Admission: RE | Admit: 2024-07-09 | Discharge: 2024-07-09 | Disposition: A | Discharge status: Home / Self Care | Source: Ambulatory Visit | Attending: Family Medicine | Admitting: Family Medicine

## 2024-07-09 DIAGNOSIS — Z1231 Encounter for screening mammogram for malignant neoplasm of breast: Secondary | ICD-10-CM | POA: Diagnosis not present

## 2024-07-14 DIAGNOSIS — S134XXA Sprain of ligaments of cervical spine, initial encounter: Secondary | ICD-10-CM | POA: Diagnosis not present

## 2024-07-21 DIAGNOSIS — S134XXA Sprain of ligaments of cervical spine, initial encounter: Secondary | ICD-10-CM | POA: Diagnosis not present

## 2024-07-22 DIAGNOSIS — M542 Cervicalgia: Secondary | ICD-10-CM | POA: Diagnosis not present

## 2024-07-22 DIAGNOSIS — M1991 Primary osteoarthritis, unspecified site: Secondary | ICD-10-CM | POA: Diagnosis not present

## 2024-07-22 DIAGNOSIS — R768 Other specified abnormal immunological findings in serum: Secondary | ICD-10-CM | POA: Diagnosis not present

## 2024-09-01 DIAGNOSIS — L7211 Pilar cyst: Secondary | ICD-10-CM | POA: Diagnosis not present

## 2024-09-06 DIAGNOSIS — J069 Acute upper respiratory infection, unspecified: Secondary | ICD-10-CM | POA: Diagnosis not present

## 2024-09-29 DIAGNOSIS — H2513 Age-related nuclear cataract, bilateral: Secondary | ICD-10-CM | POA: Diagnosis not present

## 2024-09-29 DIAGNOSIS — Z9889 Other specified postprocedural states: Secondary | ICD-10-CM | POA: Diagnosis not present

## 2024-09-29 DIAGNOSIS — H31093 Other chorioretinal scars, bilateral: Secondary | ICD-10-CM | POA: Diagnosis not present

## 2024-09-29 DIAGNOSIS — H43813 Vitreous degeneration, bilateral: Secondary | ICD-10-CM | POA: Diagnosis not present

## 2024-09-29 DIAGNOSIS — G43109 Migraine with aura, not intractable, without status migrainosus: Secondary | ICD-10-CM | POA: Diagnosis not present
